# Patient Record
Sex: Female | Born: 1990 | Race: Black or African American | Hispanic: No | Marital: Single | Smoking: Never smoker
Health system: Southern US, Community
[De-identification: ages and names within clinical notes are randomized; demographics above are authoritative.]

## PROBLEM LIST (undated history)

## (undated) DIAGNOSIS — Z9889 Other specified postprocedural states: Secondary | ICD-10-CM

## (undated) DIAGNOSIS — R011 Cardiac murmur, unspecified: Secondary | ICD-10-CM

## (undated) DIAGNOSIS — J45909 Unspecified asthma, uncomplicated: Secondary | ICD-10-CM

## (undated) HISTORY — DX: Unspecified asthma, uncomplicated: J45.909

## (undated) HISTORY — DX: Cardiac murmur, unspecified: R01.1

---
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
## 2016-08-01 DIAGNOSIS — Z9889 Other specified postprocedural states: Secondary | ICD-10-CM

## 2016-08-01 HISTORY — PX: ARTHROSCOPIC REPAIR ACL: SUR80

## 2016-08-01 HISTORY — DX: Other specified postprocedural states: Z98.890

## 2019-03-18 ENCOUNTER — Ambulatory Visit: Payer: 59 | Admitting: Internal Medicine

## 2019-03-18 ENCOUNTER — Encounter: Payer: Self-pay | Admitting: Internal Medicine

## 2019-03-18 ENCOUNTER — Other Ambulatory Visit: Payer: Self-pay

## 2019-03-18 VITALS — BP 131/87 | HR 96 | Temp 99.1°F | Resp 16 | Ht 69.0 in | Wt 188.0 lb

## 2019-03-18 DIAGNOSIS — S83411A Sprain of medial collateral ligament of right knee, initial encounter: Secondary | ICD-10-CM

## 2019-03-18 DIAGNOSIS — M25561 Pain in right knee: Secondary | ICD-10-CM

## 2019-03-18 DIAGNOSIS — Z9889 Other specified postprocedural states: Secondary | ICD-10-CM

## 2019-03-18 DIAGNOSIS — M25569 Pain in unspecified knee: Secondary | ICD-10-CM | POA: Insufficient documentation

## 2019-03-18 NOTE — Progress Notes (Signed)
Went from standing to lying postion & felt a sharp pain on the left side of the knee.

## 2019-03-18 NOTE — Progress Notes (Signed)
S - 28 y.o. Black fermale with h/o ACL repair in 2018 who presents with right knee pain after firearms training today. Squatted down to the floor and hit her knee and felt pain that increased as day progressed. Happened at about 11 and contginued with training and did some kneeling after and was getting tight and more uncomfortable. 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   No current facility-administered medications on file prior to visit.     No tob hx  O - NAD, masked  BP 131/87 (BP Location: Right Arm, Patient Position: Sitting, Cuff Size: Normal)   Pulse 96   Temp 99.1 F (37.3 C) (Oral)   Resp 16   Ht 5\' 9"  (1.753 m)   Wt 188 lb (85.3 kg)   SpO2 95%   BMI 27.76 kg/m    Examined uninjured L knee first and stable,  R  Knee - Could not get to full flexion due to pain and mild swelling (not last 20 degrees), could fully extend.  Mild swelling noted anteriorly and prob mild effusion, 1+   No bruising  Not tender with movement of the patella, no gross deformity of patella  No marked tenderness in suprapatella bursa region  + medial joint line tenderness, with more tender above the joint line and progressing inferior on the medial side, no lateral joint line tenderness with palpation  Med collateral intact without marked gap on testing, and painful on testing the MCL and with foot movement laterally, LCL intact and no pain with testing  Lachman likely neg but a lot of guarding on testing for endpoint  And and post drawer and McMurrays limited on testing today, questioned some mild pain with limited testing McMurry's testing medial meniscus  No pain  posterior,    Affect was not flat, approp with conversation  A/p - R  knee pain, concern for MCL sprain (likely Grade 1) and possible medial meniscus involvement. Noted to her likely not a re-tear of the ACL graft and await further f/u to help with more certainty.   RICE modalities reviewed  Rec'ed liberal use of ice short term  ACE today for compression done in the office and continue  Will get an x-ray today or tomorrow at the latest - ordered  Activity modifications short term and elevate important.   Can use ibuprofen prn with food, not routinely rec'ed  F/u Wed late morning at the latest to re-assess before any planned to return to work entertained and review x-ray then as well.

## 2019-03-19 ENCOUNTER — Ambulatory Visit
Admission: RE | Admit: 2019-03-19 | Discharge: 2019-03-19 | Disposition: A | Discharge status: Home / Self Care | Payer: 59 | Source: Ambulatory Visit | Attending: Internal Medicine | Admitting: Internal Medicine

## 2019-03-19 ENCOUNTER — Other Ambulatory Visit: Payer: Self-pay

## 2019-03-19 DIAGNOSIS — M25561 Pain in right knee: Secondary | ICD-10-CM

## 2019-03-20 ENCOUNTER — Other Ambulatory Visit: Payer: 59 | Admitting: Internal Medicine

## 2019-03-20 ENCOUNTER — Encounter: Payer: Self-pay | Admitting: Internal Medicine

## 2019-03-20 ENCOUNTER — Ambulatory Visit: Payer: 59 | Admitting: Internal Medicine

## 2019-03-20 VITALS — BP 125/81 | HR 75 | Temp 97.8°F | Resp 14 | Ht 69.0 in | Wt 180.0 lb

## 2019-03-20 DIAGNOSIS — S83411D Sprain of medial collateral ligament of right knee, subsequent encounter: Secondary | ICD-10-CM

## 2019-03-20 DIAGNOSIS — S8001XD Contusion of right knee, subsequent encounter: Secondary | ICD-10-CM

## 2019-03-20 DIAGNOSIS — S8001XA Contusion of right knee, initial encounter: Secondary | ICD-10-CM | POA: Insufficient documentation
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
## 2019-03-27 ENCOUNTER — Encounter: Payer: Self-pay | Admitting: Registered Nurse

## 2019-03-27 ENCOUNTER — Other Ambulatory Visit: Payer: Self-pay

## 2019-03-27 ENCOUNTER — Ambulatory Visit: Payer: 59 | Admitting: Registered Nurse

## 2019-03-27 VITALS — BP 121/66 | HR 68 | Temp 98.7°F | Resp 16 | Ht 69.0 in | Wt 181.0 lb

## 2019-03-27 DIAGNOSIS — Z008 Encounter for other general examination: Secondary | ICD-10-CM

## 2019-03-27 NOTE — Progress Notes (Signed)
Subjective:    Patient ID: Hayley Avila, female    DOB: 07-Apr-1991, 28 y.o.   MRN: [id number]  28y/o established african american female patient to clinic new to me here for follow up right knee strain.  Pain and swelling resolved.  Initial visit with Dr Dorris FetchHendrickson 03/18/2019 and date of injury.  Patient stated she feels ready to return to full duty today/here for clearance.     Review of Systems  Constitutional: Negative for activity change, appetite change, chills, diaphoresis, fatigue and fever.  HENT: Negative for trouble swallowing and voice change.   Eyes: Negative for photophobia and visual disturbance.  Respiratory: Negative for cough.   Cardiovascular: Negative for leg swelling.  Gastrointestinal: Negative for diarrhea, nausea and vomiting.  Endocrine: Negative for cold intolerance and heat intolerance.  Genitourinary: Negative for difficulty urinating.  Musculoskeletal: Negative for arthralgias, gait problem, joint swelling, myalgias, neck pain and neck stiffness.  Skin: Negative for color change, pallor, rash and wound.  Hematological: Negative for adenopathy. Does not bruise/bleed easily.  Psychiatric/Behavioral: Negative for agitation, confusion and sleep disturbance.       Objective:   Physical Exam Vitals signs and nursing note reviewed.  Constitutional:      General: She is awake. She is not in acute distress.    Appearance: Normal appearance. She is well-developed, well-groomed and normal weight. She is not ill-appearing, toxic-appearing or diaphoretic.  HENT:     Head: Normocephalic and atraumatic.     Jaw: There is normal jaw occlusion.     Right Ear: Hearing and external ear normal.     Left Ear: Hearing and external ear normal.     Nose: Nose normal.     Mouth/Throat:     Lips: Pink. No lesions.     Mouth: Mucous membranes are moist.     Pharynx: Oropharynx is clear.  Eyes:     General: Lids are normal. Vision grossly intact. Gaze aligned  appropriately. No visual field deficit or scleral icterus.       Right eye: No discharge.        Left eye: No discharge.     Extraocular Movements: Extraocular movements intact.     Conjunctiva/sclera: Conjunctivae normal.     Pupils: Pupils are equal, round, and reactive to light.  Neck:     Musculoskeletal: Normal range of motion and neck supple. Normal range of motion. No edema, erythema, neck rigidity, crepitus, injury, pain with movement or torticollis.     Trachea: Trachea normal.  Cardiovascular:     Rate and Rhythm: Normal rate and regular rhythm.     Pulses: Normal pulses.     Heart sounds: Normal heart sounds.  Pulmonary:     Effort: Pulmonary effort is normal. No respiratory distress.     Breath sounds: Normal breath sounds and air entry. No stridor, decreased air movement or transmitted upper airway sounds. No decreased breath sounds, wheezing, rhonchi or rales.     Comments: Wearing cloth mask due to covid 19 pandemic; spoke full sentences without difficulty; no cough observed in exam room Abdominal:     General: Abdomen is flat.     Palpations: Abdomen is soft.  Musculoskeletal: Normal range of motion.        General: No swelling, tenderness, deformity or signs of injury.     Right shoulder: Normal.     Left shoulder: Normal.     Right elbow: Normal.    Left elbow: Normal.     Right hip:  Normal.     Left hip: Normal.     Right knee: Normal. She exhibits normal range of motion, no swelling, no effusion, no ecchymosis, no deformity, no laceration, no erythema, normal alignment, no LCL laxity, normal patellar mobility, no bony tenderness, normal meniscus and no MCL laxity. No tenderness found. No medial joint line, no lateral joint line, no MCL, no LCL and no patellar tendon tenderness noted.     Left knee: Normal. She exhibits normal range of motion, no swelling, no effusion, no ecchymosis, no deformity, no laceration, no erythema, normal alignment, no LCL laxity, normal  patellar mobility, no bony tenderness, normal meniscus and no MCL laxity. No tenderness found. No medial joint line, no lateral joint line, no MCL, no LCL and no patellar tendon tenderness noted.     Right ankle: Normal.     Left ankle: Normal.     Cervical back: Normal.     Thoracic back: Normal.     Lumbar back: Normal.     Right hand: Normal.     Left hand: Normal.     Right upper leg: Normal.     Left upper leg: Normal.     Right lower leg: No edema.     Left lower leg: No edema.     Comments: quadraceps distal right more bulk than left; negative mcmurrary's/lachmann's/valgus/varus stress and ant/posterior drawer signs or patellar displacement apprehension bilaterally; no crepitus; no ecchymosis or swelling or TTP; full AROM without pain; gait sure and steady in hallway; on/off exam table without difficulty  Lymphadenopathy:     Head:     Right side of head: No submental, submandibular, preauricular or posterior auricular adenopathy.     Left side of head: No submental, submandibular, preauricular or posterior auricular adenopathy.     Cervical: No cervical adenopathy.     Right cervical: No superficial cervical adenopathy.    Left cervical: No superficial cervical adenopathy.  Skin:    General: Skin is warm and dry.     Capillary Refill: Capillary refill takes less than 2 seconds.     Coloration: Skin is not ashen, cyanotic, jaundiced, mottled, pale or sallow.     Findings: No abrasion, abscess, acne, bruising, burn, ecchymosis, erythema, signs of injury, laceration, lesion, petechiae, rash or wound.     Nails: There is no clubbing.        Neurological:     General: No focal deficit present.     Mental Status: She is alert and oriented to person, place, and time. Mental status is at baseline.     GCS: GCS eye subscore is 4. GCS verbal subscore is 5. GCS motor subscore is 6.     Cranial Nerves: Cranial nerves are intact. No cranial nerve deficit, dysarthria or facial asymmetry.      Sensory: Sensation is intact. No sensory deficit.     Motor: Motor function is intact. No weakness, tremor, atrophy, abnormal muscle tone or seizure activity.     Coordination: Coordination is intact. Coordination normal.     Gait: Gait is intact. Gait normal.     Comments: Lower and upper extremity strength equal 5/5  Psychiatric:        Attention and Perception: Attention and perception normal.        Mood and Affect: Mood and affect normal.        Speech: Speech normal.        Behavior: Behavior normal. Behavior is cooperative.  Thought Content: Thought content normal.        Cognition and Memory: Cognition and memory normal.        Judgment: Judgment normal.           Assessment & Plan:  A-return to duty paperwork  P-Returned to full duty paperwork completed.  Patient given copy.  See scan in Epic and paper copy in outpatient chart located at [place] clinic site.  Patient instructed to give her supervisor copy.  Follow up for re-evaluation if pain reoccurs.  Patient verbalized understanding information/instructions, agreed with plan of care and had no further questions at this time.

## 2019-03-27 NOTE — Progress Notes (Signed)
Denies pain to right knee.  States it's good. 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

## 2019-03-27 NOTE — Patient Instructions (Signed)
Please give supervisor return to work paperwork

## 2019-03-29 ENCOUNTER — Other Ambulatory Visit
Admission: RE | Admit: 2019-03-29 | Discharge: 2019-03-29 | Disposition: A | Discharge status: Home / Self Care | Payer: 59 | Attending: Family Medicine | Admitting: Family Medicine

## 2019-05-23 ENCOUNTER — Ambulatory Visit: Payer: 59

## 2019-06-13 ENCOUNTER — Telehealth: Payer: Self-pay

## 2019-06-13 DIAGNOSIS — Z03818 Encounter for observation for suspected exposure to other biological agents ruled out: Secondary | ICD-10-CM | POA: Diagnosis not present

## 2019-06-13 NOTE — Telephone Encounter (Signed)
In the last 24 hours have you experienced any of these symptoms . Difficulty breathing - NO . Nasal congestion - YES . New cough - NO . Runny nose - NO . Shortness of breath - NO . New sore throat - YES . Unexplained body aches - NO . Nausea or vomiting - NO . Diarrhea - NO . Loss of taste or smell - NO . Fever (temp>100 F/37.8 C) or chills - NO  Exposure:  . Have you been in close contact with someone with confirmed diagnosis of COVID or person under going testing in past 14 days? 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 Have you been tested for COVID? If yes date, location, results in known - NO  . Have you been living in the same home as a person with confirmed diagnosis of COVID or a person undergoing testing? (household contact) - NO  . Have you been diagnosed with COVID or are you waiting on test results? - NO  . Have you traveled anywhere in the past 4 weeks? If yes where - Yes, traveled to [place], [place] a couple of days ago.  Advised Sheldon to go to [facility] for covid test.  Found out [facility] had to shut covid screening down today. Called Toniya & rerouted her to [facility] for covid test.  Verbalized understanding. [facility] Covid form faxed to [phone]. [facility] Covid team aware of individual being sent for covid test.  AMD

## 2019-10-11 ENCOUNTER — Encounter: Payer: Self-pay | Admitting: Emergency Medicine

## 2019-10-11 ENCOUNTER — Emergency Department
Admission: EM | Admit: 2019-10-11 | Discharge: 2019-10-11 | Disposition: A | Discharge status: Home / Self Care | Payer: Worker's Compensation | Attending: Emergency Medicine | Admitting: Emergency Medicine

## 2019-10-11 ENCOUNTER — Other Ambulatory Visit: Payer: Self-pay

## 2019-10-11 DIAGNOSIS — J45909 Unspecified asthma, uncomplicated: Secondary | ICD-10-CM | POA: Insufficient documentation

## 2019-10-11 DIAGNOSIS — Y990 Civilian activity done for income or pay: Secondary | ICD-10-CM | POA: Insufficient documentation

## 2019-10-11 DIAGNOSIS — Z91010 Allergy to peanuts: Secondary | ICD-10-CM | POA: Diagnosis not present

## 2019-10-11 DIAGNOSIS — Z041 Encounter for examination and observation following transport accident: Secondary | ICD-10-CM | POA: Insufficient documentation

## 2019-10-11 DIAGNOSIS — R0789 Other chest pain: Secondary | ICD-10-CM | POA: Diagnosis not present
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
## 2019-11-12 ENCOUNTER — Ambulatory Visit: Payer: Self-pay

## 2019-11-12 ENCOUNTER — Other Ambulatory Visit: Payer: Self-pay

## 2019-11-12 DIAGNOSIS — Z0000 Encounter for general adult medical examination without abnormal findings: Secondary | ICD-10-CM

## 2019-11-12 LAB — POCT URINALYSIS DIPSTICK
Bilirubin, UA: NEGATIVE
Glucose, UA: NEGATIVE
Ketones, UA: NEGATIVE
Leukocytes, UA: NEGATIVE
Nitrite, UA: NEGATIVE
Protein, UA: NEGATIVE
Spec Grav, UA: 1.02 (ref 1.010–1.025)
Urobilinogen, UA: 0.2 E.U./dL
pH, UA: 6 (ref 5.0–8.0)

## 2019-11-12 NOTE — Progress Notes (Signed)
Scheduled to complete physical 11/19/2019 with Bridget Hartshorn, PA-C (Interim Provider).  AMD

## 2019-11-13 LAB — CMP12+LP+TP+TSH+6AC+CBC/D/PLT
ALT: 8 IU/L (ref 0–32)
AST: 15 IU/L (ref 0–40)
Albumin/Globulin Ratio: 1.7 (ref 1.2–2.2)
Albumin: 4.5 g/dL (ref 3.9–5.0)
Alkaline Phosphatase: 63 IU/L (ref 39–117)
BUN/Creatinine Ratio: 10 (ref 9–23)
BUN: 9 mg/dL (ref 6–20)
Basophils Absolute: 0 10*3/uL (ref 0.0–0.2)
Basos: 1 %
Bilirubin Total: 0.5 mg/dL (ref 0.0–1.2)
Calcium: 9.7 mg/dL (ref 8.7–10.2)
Chloride: 103 mmol/L (ref 96–106)
Chol/HDL Ratio: 2.5 ratio (ref 0.0–4.4)
Cholesterol, Total: 195 mg/dL (ref 100–199)
Creatinine, Ser: 0.89 mg/dL (ref 0.57–1.00)
EOS (ABSOLUTE): 0.1 10*3/uL (ref 0.0–0.4)
Eos: 2 %
Estimated CHD Risk: <0.5 times avg. (ref 0.0–1.0)
Free Thyroxine Index: 1.5 (ref 1.2–4.9)
GFR calc Af Amer: 103 mL/min/{1.73_m2} (ref >59)
GFR calc non Af Amer: 90 mL/min/{1.73_m2} (ref >59)
GGT: 43 IU/L (ref 0–60)
Globulin, Total: 2.6 g/dL (ref 1.5–4.5)
Glucose: 87 mg/dL (ref 65–99)
HDL: 77 mg/dL (ref >39)
Hematocrit: 40.3 % (ref 34.0–46.6)
Hemoglobin: 13.2 g/dL (ref 11.1–15.9)
Immature Grans (Abs): 0 10*3/uL (ref 0.0–0.1)
Immature Granulocytes: 0 %
Iron: 127 ug/dL (ref 27–159)
LDH: 126 IU/L (ref 119–226)
LDL Chol Calc (NIH): 103 mg/dL — ABNORMAL HIGH (ref 0–99)
Lymphocytes Absolute: 1.5 10*3/uL (ref 0.7–3.1)
Lymphs: 40 %
MCH: 28.6 pg (ref 26.6–33.0)
MCHC: 32.8 g/dL (ref 31.5–35.7)
MCV: 87 fL (ref 79–97)
Monocytes Absolute: 0.3 10*3/uL (ref 0.1–0.9)
Monocytes: 9 %
Neutrophils Absolute: 1.8 10*3/uL (ref 1.4–7.0)
Neutrophils: 48 %
Phosphorus: 3.3 mg/dL (ref 3.0–4.3)
Platelets: 281 10*3/uL (ref 150–450)
Potassium: 4.3 mmol/L (ref 3.5–5.2)
RBC: 4.62 x10E6/uL (ref 3.77–5.28)
RDW: 12.4 % (ref 11.7–15.4)
Sodium: 139 mmol/L (ref 134–144)
T3 Uptake Ratio: 26 % (ref 24–39)
T4, Total: 5.6 ug/dL (ref 4.5–12.0)
TSH: 2.49 u[IU]/mL (ref 0.450–4.500)
Total Protein: 7.1 g/dL (ref 6.0–8.5)
Triglycerides: 83 mg/dL (ref 0–149)
Uric Acid: 5.1 mg/dL (ref 2.6–6.2)
VLDL Cholesterol Cal: 15 mg/dL (ref 5–40)
WBC: 3.7 10*3/uL (ref 3.4–10.8)

## 2019-11-19 ENCOUNTER — Encounter: Payer: Self-pay | Admitting: Emergency Medicine

## 2019-11-19 ENCOUNTER — Other Ambulatory Visit: Payer: Self-pay

## 2019-11-19 ENCOUNTER — Ambulatory Visit: Payer: Self-pay | Admitting: Emergency Medicine

## 2019-11-19 VITALS — BP 120/83 | HR 94 | Temp 98.8°F | Resp 12 | Ht 69.0 in | Wt 185.0 lb

## 2019-11-19 DIAGNOSIS — Z0000 Encounter for general adult medical examination without abnormal findings: Secondary | ICD-10-CM

## 2019-11-19 NOTE — Progress Notes (Signed)
Labs completed 11/12/19.  AMD

## 2019-11-19 NOTE — Progress Notes (Signed)
   I have reviewed the triage vital signs and the nursing notes.   HISTORY  Chief Complaint Employment Physical  HPI Hayley Avila is a 29 y.o. female is here for annual physical.  No complaints.        Past Medical History:  Diagnosis Date  . Asthma   . Heart murmur   . S/P ACL surgery 2018    Patient Active Problem List   Diagnosis Date Noted  . Contusion of right knee 03/20/2019  . Knee pain 03/18/2019  . Sprain of medial collateral ligament of right knee 03/18/2019  . History of repair of ACL 03/18/2019    Past Surgical History:  Procedure Laterality Date  . ARTHROSCOPIC REPAIR ACL Right 2018  . EYE SURGERY Right 2009   Cyst removed from schlers    Prior to Admission medications   Medication Sig Start Date End Date Taking? Authorizing Provider  albuterol (VENTOLIN HFA) 108 (90 Base) MCG/ACT inhaler Inhale 2 puffs into the lungs every 6 (six) hours as needed for wheezing or shortness of breath.   Yes [provider]    Allergies Peanut oil and Celery oil  Family History  Problem Relation Age of Onset  . Hypertension Mother   . Hypertension Father   . Multiple sclerosis Sister   . Colon cancer Maternal Grandmother     Social History Social History   Tobacco Use  . Smoking status: Never Smoker  . Smokeless tobacco: Never Used  Substance Use Topics  . Alcohol use: Yes  . Drug use: Never    Review of Systems Constitutional: No fever/chills Eyes: No visual changes. ENT: No complaints Cardiovascular: Denies chest pain. Respiratory: Denies shortness of breath. Gastrointestinal: No abdominal pain.  No nausea, no vomiting.  Genitourinary: Negative for dysuria. Musculoskeletal: Negative for back or joint aches. Skin: Negative for rash. Neurological: Negative for headaches, focal weakness or numbness.  ____________________________________________   PHYSICAL EXAM:  Constitutional: Alert and oriented. Well appearing and in no acute  distress. Eyes: Conjunctivae are normal. PERRL. EOMI. Head: Atraumatic. Neck: No stridor.  Nontender cervical spine to palpation posteriorly. Cardiovascular: Normal rate, regular rhythm. Grossly normal heart sounds.  Good peripheral circulation. Respiratory: Normal respiratory effort.  No retractions. Lungs CTAB. Gastrointestinal: Soft and nontender. No distention.  Bowel sounds normoactive x4 quadrants. Musculoskeletal: Nontender thoracic or lumbar spine.  Able to move upper and lower extremities without any difficulty.  Good muscle strength bilaterally.  Normal gait was noted. Neurologic:  Normal speech and language. No gross focal neurologic deficits are appreciated. No gait instability. Skin:  Skin is warm, dry and intact. No rash noted. Psychiatric: Mood and affect are normal. Speech and behavior are normal.  ____________________________________________   LABS Discussed with patient. ___________________________________________   FINAL CLINICAL IMPRESSION(S)  29 year old female presents to the clinic for annual physical.  She has no new complaints since her last physical.  Labs were reviewed.  No physical findings that were remarkable.  Normal physical exam.    ED Discharge Orders    None       Note:  This document was prepared using Dragon voice recognition software and may include unintentional dictation errors.

## 2019-11-25 DIAGNOSIS — J029 Acute pharyngitis, unspecified: Secondary | ICD-10-CM | POA: Diagnosis not present

## 2019-11-25 DIAGNOSIS — Z03818 Encounter for observation for suspected exposure to other biological agents ruled out: Secondary | ICD-10-CM | POA: Diagnosis not present

## 2020-03-06 DIAGNOSIS — Z03818 Encounter for observation for suspected exposure to other biological agents ruled out: Secondary | ICD-10-CM | POA: Diagnosis not present

## 2020-03-06 DIAGNOSIS — U071 COVID-19: Secondary | ICD-10-CM | POA: Diagnosis not present

## 2020-03-06 DIAGNOSIS — J40 Bronchitis, not specified as acute or chronic: Secondary | ICD-10-CM | POA: Diagnosis not present

## 2020-05-25 ENCOUNTER — Ambulatory Visit: Payer: Self-pay | Admitting: Physician Assistant

## 2020-05-25 ENCOUNTER — Other Ambulatory Visit: Payer: Self-pay

## 2020-05-25 VITALS — BP 120/83 | HR 76 | Temp 98.9°F | Resp 14 | Ht 69.0 in | Wt 185.0 lb

## 2020-05-25 DIAGNOSIS — S83411A Sprain of medial collateral ligament of right knee, initial encounter: Secondary | ICD-10-CM

## 2020-05-25 MED ORDER — NAPROXEN 500 MG PO TABS: 500.0000 mg | ORAL_TABLET | Freq: Two times a day (BID) | ORAL | 0 refills | Status: DC

## 2020-05-25 NOTE — Progress Notes (Signed)
   Subjective: Left knee pain    Patient ID: Hayley Avila, female    DOB: 1991/07/18, 29 y.o.   MRN: [id number]  HPI Patient presents with left knee pain secondary to a training incident 3 days ago.  Patient states she felt a "pop" medial aspect of her right knee.  Patient states pain increased with flexion of the knee and kneeling.  Patient described the pain as "achy".  Mild relief taking anti-inflammatory medications.  Patient works as a Emergency planning/management officer.   Review of Systems      Negative except for complaint Objective:   Physical Exam No acute distress.  Patient ambulates with slightly atypical gait.  Examination of the knee shows no obvious deformity.  Patient has inferior patella effusion.  Patient is moderate guarding palpation of the insertion point of the MCL.  Patient decreased strength against resistance with right knee comparison to the left.       Assessment & Plan: Knee pain secondary to MCL strain.  Patient placed in elastic knee support and given discharge care instruction.  Patient given prescription naproxen to take twice a day.  Patient will follow up in 3 days.

## 2020-05-28 ENCOUNTER — Ambulatory Visit: Payer: Self-pay | Admitting: Nurse Practitioner

## 2020-05-28 ENCOUNTER — Other Ambulatory Visit: Payer: Self-pay

## 2020-05-28 ENCOUNTER — Encounter: Payer: Self-pay | Admitting: Nurse Practitioner

## 2020-05-28 VITALS — BP 121/76 | HR 75 | Temp 98.3°F | Resp 14 | Ht 69.0 in | Wt 185.0 lb

## 2020-05-28 DIAGNOSIS — S83401D Sprain of unspecified collateral ligament of right knee, subsequent encounter: Secondary | ICD-10-CM

## 2020-05-28 MED ORDER — PREDNISONE 10 MG (21) PO TBPK: ORAL_TABLET | ORAL | 0 refills | Status: DC

## 2020-05-28 NOTE — Addendum Note (Signed)
Addended by: Janne Napoleon on: 05/28/2020 04:35 PM   Modules accepted: Orders

## 2020-05-28 NOTE — Progress Notes (Signed)
   Subjective:    Patient ID: Hayley Avila, female    DOB: March 28, 1991, 29 y.o.   MRN: [id number]  HPI Hayley Avila is a 29 y.o. female who presents to the [facility] for f/u of right knee pain s/p injury 05/22/20 while she was in a training session for her job in the [employer] department. She reports that the pain is the same as it was when she injured it. She stopped the Naprosyn because it made her sleepy. Hx of ACL injury 3 or more years ago.   Past Medical History:  Diagnosis Date  . Asthma   . Heart murmur   . S/P ACL surgery 2018   Current Outpatient Medications  Medication Instructions  . albuterol (VENTOLIN HFA) 108 (90 Base) MCG/ACT inhaler 2 puffs, Inhalation, Every 6 hours PRN  . naproxen (NAPROSYN) 500 mg, Oral, 2 times daily with meals   Allergies  Allergen Reactions  . Peanut Oil Rash  . Celery Oil    Review of Systems  Musculoskeletal: Positive for joint swelling.       Right knee pain  All other systems reviewed and are negative.     Objective:    Right knee with swelling and tenderness over the MCL. No obvious deformity, minimal effusion. Patient can flex and extend the knee with moderate pain. Patient is able to ambulate with slight limp. Pedal pulses 2+.     Assessment & Plan:  29 y.o. female with continued pain s/p right knee injury 10/22. Continue wearing knee sleeve. Will Rx prednisone and she will take OTC tylenol or ibuprofen as needed. She will return in 4 days for follow up. If no improvement will refer to ortho.

## 2020-05-28 NOTE — Progress Notes (Signed)
Pt states knee brace is not helping and pain is still the same, no worse or better.Hayley Avila

## 2020-06-02 ENCOUNTER — Ambulatory Visit: Payer: 59 | Admitting: Nurse Practitioner

## 2020-06-04 ENCOUNTER — Other Ambulatory Visit: Payer: Self-pay

## 2020-06-04 ENCOUNTER — Encounter: Payer: Self-pay | Admitting: Physician Assistant

## 2020-06-04 ENCOUNTER — Ambulatory Visit: Payer: Self-pay | Admitting: Physician Assistant

## 2020-06-04 VITALS — BP 121/89 | HR 85 | Temp 98.8°F | Resp 14 | Ht 69.0 in | Wt 196.0 lb

## 2020-06-04 DIAGNOSIS — S83401D Sprain of unspecified collateral ligament of right knee, subsequent encounter: Secondary | ICD-10-CM

## 2020-06-04 NOTE — Progress Notes (Signed)
   Subjective: Right knee sprain    Patient ID: Hayley Avila, female    DOB: 1990/09/01, 29 y.o.   MRN: [id number]  HPI Patient presents for evaluation of right knee pain secondary to MCL sprain.  Patient states much improvement is able to ambulate only mild discomfort. Review of Systems    Negative except for complaint Objective:   Physical Exam No acute distress.  Patient has normal gait.  Examination of the right knee reveals no edema, erythema ecchymosis or crepitus with palpation.  Patient has full and equal range of motion follow-up complaint of pain.  Patient was able to perform repeated squatting maneuvers for complaint.       Assessment & Plan: Resolved right knee MCL strain  Patient may return back to full duties effective 06/05/2020.  Return back to clinic if recurrence of complaint.

## 2020-07-31 DIAGNOSIS — Z03818 Encounter for observation for suspected exposure to other biological agents ruled out: Secondary | ICD-10-CM | POA: Diagnosis not present

## 2020-07-31 DIAGNOSIS — Z1152 Encounter for screening for COVID-19: Secondary | ICD-10-CM | POA: Diagnosis not present

## 2020-08-03 ENCOUNTER — Other Ambulatory Visit: Payer: Self-pay

## 2020-08-03 ENCOUNTER — Encounter: Payer: Self-pay | Admitting: Nurse Practitioner

## 2020-08-03 ENCOUNTER — Ambulatory Visit: Payer: Self-pay | Admitting: Nurse Practitioner

## 2020-08-03 VITALS — BP 138/86 | HR 86 | Temp 98.4°F | Resp 14 | Ht 69.0 in | Wt 197.0 lb

## 2020-08-03 DIAGNOSIS — J209 Acute bronchitis, unspecified: Secondary | ICD-10-CM

## 2020-08-03 DIAGNOSIS — J4521 Mild intermittent asthma with (acute) exacerbation: Secondary | ICD-10-CM

## 2020-08-03 MED ORDER — AZITHROMYCIN 250 MG PO TABS: ORAL_TABLET | ORAL | 0 refills | Status: DC

## 2020-08-03 MED ORDER — PREDNISONE 20 MG PO TABS: 20.0000 mg | ORAL_TABLET | Freq: Two times a day (BID) | ORAL | 0 refills | Status: AC

## 2020-08-03 NOTE — Progress Notes (Signed)
   Subjective:    Patient ID: Hayley Avila, female    DOB: Feb 18, 1991, 30 y.o.   MRN: [id number]  HPI  30 year old female history of asthma who started to feel sick with flu like symptoms 5 days ago. She was exposed to COVID 24 hours prior to symptom onset, she went for COVID testing the day she became symptomatic which was 07/31/20. Tested at [facility] with a rapid COVID test per patient was negative.  Since that time she has had a loss of appetite, some nausea, worsening cough associated with mucous production and wheezing also a fever up until last night she was 100 degrees.   She has been using her inhaler for relief and OTC cough and congestion medication  She had COVID over the summer and was on a prednisone taper at that time  Today's Vitals   08/03/20 1407  BP: 138/86  Pulse: 86  Resp: 14  Temp: 98.4 F (36.9 C)  TempSrc: Oral  SpO2: 95%  Weight: 197 lb (89.4 kg)  Height: 5\' 9"  (1.753 m)   Body mass index is 29.09 kg/m.  Review of Systems  Constitutional: Positive for fatigue and fever.  HENT: Positive for congestion and sinus pressure.   Respiratory: Positive for cough and wheezing.   Cardiovascular: Negative.   Gastrointestinal: Positive for nausea.  Musculoskeletal: Positive for myalgias.  Neurological: Positive for headaches.       Objective:   Physical Exam Constitutional:      Appearance: She is ill-appearing.  HENT:     Head: Normocephalic.     Nose: Nose normal.     Mouth/Throat:     Mouth: Mucous membranes are moist.  Eyes:     Pupils: Pupils are equal, round, and reactive to light.  Cardiovascular:     Rate and Rhythm: Normal rate and regular rhythm.  Pulmonary:     Breath sounds: Examination of the right-upper field reveals wheezing. Examination of the left-upper field reveals wheezing and rhonchi. Examination of the left-middle field reveals rhonchi. Wheezing and rhonchi present.  Musculoskeletal:     Cervical back: Neck supple.   Skin:    General: Skin is warm and dry.     Capillary Refill: Capillary refill takes less than 2 seconds.  Neurological:     Mental Status: She is alert and oriented to person, place, and time.  Psychiatric:        Mood and Affect: Mood normal.           Assessment & Plan:  Discussed with patient deep breathing exercises, proper nutrition and adequate intake with medications prescribed  She feels overall fatigued and unable to return to work at this time.  Will excuse for 48 hours while antibiotic and steroid course takes effect, she will continue to use her inhaler as directed  She will RTC if symptoms persist or with new concerns at that time will otherwise RT work 08/05/20  Meds ordered this encounter  Medications  . azithromycin (ZITHROMAX) 250 MG tablet    Sig: Take 2 tablets on day 1, then one tablet daily on days 2-5. Take with food.    Dispense:  6 tablet    Refill:  0  . predniSONE (DELTASONE) 20 MG tablet    Sig: Take 1 tablet (20 mg total) by mouth 2 (two) times daily with a meal for 5 days.    Dispense:  10 tablet    Refill:  0

## 2020-08-03 NOTE — Progress Notes (Signed)
Covid Screen completed at [facility].  S/Sx started last Thursday SOB, intermittent fever - has been up to 100, cough, nausea, headache Fatigue & sore throat & body aches resolved  Has used Advil & cough medicine  Hx of Asthma  Non-vaccinated  AMD

## 2020-12-04 ENCOUNTER — Ambulatory Visit: Payer: 59 | Admitting: Emergency Medicine

## 2020-12-07 ENCOUNTER — Ambulatory Visit: Payer: 59 | Admitting: Physician Assistant

## 2020-12-09 DIAGNOSIS — S76311A Strain of muscle, fascia and tendon of the posterior muscle group at thigh level, right thigh, initial encounter: Secondary | ICD-10-CM | POA: Diagnosis not present

## 2020-12-24 ENCOUNTER — Other Ambulatory Visit: Payer: Self-pay

## 2020-12-24 ENCOUNTER — Encounter: Payer: Self-pay | Admitting: Physician Assistant

## 2020-12-24 ENCOUNTER — Ambulatory Visit: Payer: Self-pay | Admitting: Physician Assistant

## 2020-12-24 VITALS — BP 121/84 | HR 101 | Temp 97.8°F | Resp 14 | Ht 69.0 in | Wt 195.0 lb

## 2020-12-24 DIAGNOSIS — S76311A Strain of muscle, fascia and tendon of the posterior muscle group at thigh level, right thigh, initial encounter: Secondary | ICD-10-CM

## 2020-12-24 NOTE — Progress Notes (Signed)
   Subjective: Right hamstring strain    Patient ID: Hayley Avila, female    DOB: [birth year], 30 y.o.   MRN: [id number]  HPI Patient requests return to work evaluation status post right hamstring strain which occurred on 12/09/2020.  Patient states she is now returned to baseline for physical activities.  Denies pain.   Review of Systems Negative septal complaint    Objective:   Physical Exam No obvious right lower extremity deformity.  Patient has full and equal l range of motion of the right lower extremity.  Strength against resistance 5/5 in comparison to the left lower extremity.       Assessment & Plan: Resolved hamstring strain  Patient return back to full duty.  Follow-up as necessary.

## 2021-01-01 ENCOUNTER — Other Ambulatory Visit: Payer: Self-pay

## 2021-01-01 ENCOUNTER — Ambulatory Visit: Payer: Self-pay

## 2021-01-01 DIAGNOSIS — Z0000 Encounter for general adult medical examination without abnormal findings: Secondary | ICD-10-CM

## 2021-01-01 NOTE — Progress Notes (Signed)
Pt scheduled to complete physical with Ron Smith,PA-C.  CL,RMA

## 2021-01-02 LAB — CMP12+LP+TP+TSH+6AC+CBC/D/PLT
ALT: 11 IU/L (ref 0–32)
AST: 15 IU/L (ref 0–40)
Albumin/Globulin Ratio: 2.2 (ref 1.2–2.2)
Albumin: 4.7 g/dL (ref 3.9–5.0)
Alkaline Phosphatase: 64 IU/L (ref 44–121)
BUN/Creatinine Ratio: 11 (ref 9–23)
BUN: 9 mg/dL (ref 6–20)
Basophils Absolute: 0 10*3/uL (ref 0.0–0.2)
Basos: 1 %
Bilirubin Total: 0.3 mg/dL (ref 0.0–1.2)
Calcium: 9.5 mg/dL (ref 8.7–10.2)
Chloride: 104 mmol/L (ref 96–106)
Chol/HDL Ratio: 2.8 ratio (ref 0.0–4.4)
Cholesterol, Total: 202 mg/dL — ABNORMAL HIGH (ref 100–199)
Creatinine, Ser: 0.83 mg/dL (ref 0.57–1.00)
EOS (ABSOLUTE): 0.1 10*3/uL (ref 0.0–0.4)
Eos: 3 %
Estimated CHD Risk: <0.5 times avg. (ref 0.0–1.0)
Free Thyroxine Index: 1.6 (ref 1.2–4.9)
GGT: 48 IU/L (ref 0–60)
Globulin, Total: 2.1 g/dL (ref 1.5–4.5)
Glucose: 98 mg/dL (ref 65–99)
HDL: 71 mg/dL (ref >39)
Hematocrit: 38.3 % (ref 34.0–46.6)
Hemoglobin: 12.9 g/dL (ref 11.1–15.9)
Immature Grans (Abs): 0 10*3/uL (ref 0.0–0.1)
Immature Granulocytes: 0 %
Iron: 75 ug/dL (ref 27–159)
LDH: 134 IU/L (ref 119–226)
LDL Chol Calc (NIH): 113 mg/dL — ABNORMAL HIGH (ref 0–99)
Lymphocytes Absolute: 1.7 10*3/uL (ref 0.7–3.1)
Lymphs: 43 %
MCH: 27.9 pg (ref 26.6–33.0)
MCHC: 33.7 g/dL (ref 31.5–35.7)
MCV: 83 fL (ref 79–97)
Monocytes Absolute: 0.4 10*3/uL (ref 0.1–0.9)
Monocytes: 10 %
Neutrophils Absolute: 1.7 10*3/uL (ref 1.4–7.0)
Neutrophils: 43 %
Phosphorus: 3.9 mg/dL (ref 3.0–4.3)
Platelets: 274 10*3/uL (ref 150–450)
Potassium: 4.1 mmol/L (ref 3.5–5.2)
RBC: 4.62 x10E6/uL (ref 3.77–5.28)
RDW: 12.2 % (ref 11.7–15.4)
Sodium: 138 mmol/L (ref 134–144)
T3 Uptake Ratio: 27 % (ref 24–39)
T4, Total: 6 ug/dL (ref 4.5–12.0)
TSH: 3.9 u[IU]/mL (ref 0.450–4.500)
Total Protein: 6.8 g/dL (ref 6.0–8.5)
Triglycerides: 100 mg/dL (ref 0–149)
Uric Acid: 4.6 mg/dL (ref 2.6–6.2)
VLDL Cholesterol Cal: 18 mg/dL (ref 5–40)
WBC: 4 10*3/uL (ref 3.4–10.8)
eGFR: 98 mL/min/{1.73_m2} (ref >59)

## 2021-01-07 ENCOUNTER — Ambulatory Visit: Payer: Self-pay | Admitting: Physician Assistant

## 2021-01-07 ENCOUNTER — Encounter: Payer: Self-pay | Admitting: Physician Assistant

## 2021-01-07 ENCOUNTER — Other Ambulatory Visit: Payer: Self-pay

## 2021-01-07 VITALS — BP 119/82 | HR 78 | Temp 97.3°F | Resp 12 | Ht 69.0 in | Wt 201.0 lb

## 2021-01-07 DIAGNOSIS — Z0000 Encounter for general adult medical examination without abnormal findings: Secondary | ICD-10-CM

## 2021-01-07 NOTE — Progress Notes (Signed)
   Subjective: Annual physical exam    Patient ID: Hayley Avila, female    DOB: 1991/04/19, 30 y.o.   MRN: [id number]  HPI Patient presents annual physical exam.  Patient with no concerns or complaints.   Review of Systems     Objective:   Physical Exam No acute distress.  Temperature 97.3, pulse 78, respiration 12, BP is 119/82, and patient 97% O2 sat on room air.  Patient weighs 201 pounds and BMI is 29.7. HEENT is unremarkable.  Neck is supple for adenopathy or bruits.  Lungs are clear to auscultation.  Heart regular rate and rhythm. Abdomen is negative HSM, normoactive bowel sounds, soft, nontender to palpation. No obvious deformity to the upper or lower extremities.  Patient has full and equal range of motion of the upper and lower extremities. No obvious deformity to the cervical lumbar spine.  Patient has full and equal range of motion of the cervical lumbar spine. Cranial nerves II through XII grossly intact.  DTR 2+ without clonus.       Assessment & Plan: Well exam.   Discussed lab results with patient showing slight elevation of cholesterol.  Advised conservative treatment consist of diet and exercise.

## 2021-01-07 NOTE — Progress Notes (Signed)
Hep B & HIV Screening - Not interested today.  May consider at a later time. Tdap - according to paper chart in clinic patient last received Tdap in 2014 PAP - [facility] doesn't do PAP smears - Encouraged to schedule appt with one of the following :  [facility]; [facility]; [facility] or Dr. Toya Smothers.  States she has been to [facility] in the past.  Will schedule her own GYN appt.  AMD

## 2021-03-16 ENCOUNTER — Other Ambulatory Visit: Payer: Self-pay

## 2021-03-16 ENCOUNTER — Ambulatory Visit: Payer: Self-pay | Admitting: Physician Assistant

## 2021-03-16 ENCOUNTER — Encounter: Payer: Self-pay | Admitting: Physician Assistant

## 2021-03-16 VITALS — BP 133/88 | HR 96 | Temp 98.2°F | Resp 14 | Ht 69.0 in | Wt 190.0 lb

## 2021-03-16 DIAGNOSIS — S0083XA Contusion of other part of head, initial encounter: Secondary | ICD-10-CM
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
## 2021-05-06 DIAGNOSIS — Z3202 Encounter for pregnancy test, result negative: Secondary | ICD-10-CM | POA: Diagnosis not present

## 2021-05-06 DIAGNOSIS — R002 Palpitations: Secondary | ICD-10-CM | POA: Diagnosis not present

## 2021-05-07 ENCOUNTER — Other Ambulatory Visit: Payer: Self-pay

## 2021-05-07 ENCOUNTER — Emergency Department: Payer: 59

## 2021-05-07 ENCOUNTER — Encounter: Payer: Self-pay | Admitting: Emergency Medicine

## 2021-05-07 ENCOUNTER — Emergency Department
Admission: EM | Admit: 2021-05-07 | Discharge: 2021-05-07 | Disposition: A | Discharge status: Home / Self Care | Payer: 59 | Attending: Emergency Medicine | Admitting: Emergency Medicine

## 2021-05-07 DIAGNOSIS — R0789 Other chest pain: Secondary | ICD-10-CM | POA: Diagnosis not present

## 2021-05-07 DIAGNOSIS — J45909 Unspecified asthma, uncomplicated: Secondary | ICD-10-CM | POA: Insufficient documentation

## 2021-05-07 DIAGNOSIS — R002 Palpitations: Secondary | ICD-10-CM | POA: Diagnosis not present

## 2021-05-07 DIAGNOSIS — R079 Chest pain, unspecified: Secondary | ICD-10-CM | POA: Diagnosis not present

## 2021-05-07 DIAGNOSIS — I493 Ventricular premature depolarization: Secondary | ICD-10-CM | POA: Diagnosis not present

## 2021-05-07 DIAGNOSIS — I491 Atrial premature depolarization: Secondary | ICD-10-CM | POA: Diagnosis not present

## 2021-05-07 LAB — BASIC METABOLIC PANEL
Anion gap: 11 (ref 5–15)
BUN: 11 mg/dL (ref 6–20)
CO2: 24 mmol/L (ref 22–32)
Calcium: 9.4 mg/dL (ref 8.9–10.3)
Chloride: 101 mmol/L (ref 98–111)
Creatinine, Ser: 0.94 mg/dL (ref 0.44–1.00)
GFR, Estimated: >60 mL/min (ref >60)
Glucose, Bld: 108 mg/dL — ABNORMAL HIGH (ref 70–99)
Potassium: 3.5 mmol/L (ref 3.5–5.1)
Sodium: 136 mmol/L (ref 135–145)

## 2021-05-07 LAB — CBC
HCT: 40.6 % (ref 36.0–46.0)
Hemoglobin: 13.9 g/dL (ref 12.0–15.0)
MCH: 29.1 pg (ref 26.0–34.0)
MCHC: 34.2 g/dL (ref 30.0–36.0)
MCV: 85.1 fL (ref 80.0–100.0)
Platelets: 269 10*3/uL (ref 150–400)
RBC: 4.77 MIL/uL (ref 3.87–5.11)
RDW: 12.4 % (ref 11.5–15.5)
WBC: 5.7 10*3/uL (ref 4.0–10.5)
nRBC: 0 % (ref 0.0–0.2)

## 2021-05-07 LAB — T4, FREE: Free T4: 0.71 ng/dL (ref 0.61–1.12)

## 2021-05-07 LAB — MAGNESIUM: Magnesium: 1.8 mg/dL (ref 1.7–2.4)

## 2021-05-07 LAB — TROPONIN I (HIGH SENSITIVITY): Troponin I (High Sensitivity): 4 ng/L (ref <18)

## 2021-05-07 LAB — TSH: TSH: 5.06 u[IU]/mL — ABNORMAL HIGH (ref 0.350–4.500)

## 2021-05-07 NOTE — ED Notes (Signed)
Lab call for new lab add on

## 2021-05-07 NOTE — ED Provider Notes (Signed)
[facility] Emergency Department Provider Note  ____________________________________________   Event Date/Time   First MD Initiated Contact with Patient 05/07/21 0401     (approximate)  I have reviewed the triage vital signs and the nursing notes.   HISTORY  Chief Complaint Chest Pain    HPI Hayley Avila is a 30 y.o. female with history of heart murmur, asthma who presents to the emergency department with complaints of palpitations for the past 2 to 3 days.  States she went to urgent care and they told her if she developed any chest pain she should come to the emergency department.  States today she started having sharp central chest pain that would last for about 1 to 2 seconds and then resolved.  No associated fevers, cough, shortness of breath, nausea, vomiting, diarrhea.  She states she just feels her heart "beating".  She denies ever having similar symptoms.  She does drink 1 cup of coffee a day.  Denies any other increased caffeine use, stimulant use, illicit drug use.  No history of PE, DVT, exogenous estrogen use, recent fractures, surgery, trauma, hospitalization, prolonged travel or other immobilization. No lower extremity swelling or pain. No calf tenderness.  No chest pain currently.        Past Medical History:  Diagnosis Date   Asthma    Heart murmur    S/P ACL surgery 2018    Patient Active Problem List   Diagnosis Date Noted   Contusion of right knee 03/20/2019   Knee pain 03/18/2019   Sprain of medial collateral ligament of right knee 03/18/2019   History of repair of ACL 03/18/2019    Past Surgical History:  Procedure Laterality Date   ARTHROSCOPIC REPAIR ACL Right 2018   EYE SURGERY Right 2009   Cyst removed from schlers    Prior to Admission medications   Medication Sig Start Date End Date Taking? Authorizing Provider  albuterol (VENTOLIN HFA) 108 (90 Base) MCG/ACT inhaler Inhale 2 puffs into the lungs every 6 (six)  hours as needed for wheezing or shortness of breath.    [provider]  naproxen (NAPROSYN) 500 MG tablet Take 1 tablet (500 mg total) by mouth 2 (two) times daily with a meal. 05/25/20   Joni Reining, PA-C    Allergies Peanut oil and Celery oil  Family History  Problem Relation Age of Onset   Hypertension Mother    Hypertension Father    Multiple sclerosis Sister    Colon cancer Maternal Grandmother     Social History Social History   Tobacco Use   Smoking status: Never   Smokeless tobacco: Never  Vaping Use   Vaping Use: Former  Substance Use Topics   Alcohol use: Yes   Drug use: Never    Review of Systems Constitutional: No fever. Eyes: No visual changes. ENT: No sore throat. Cardiovascular: + chest pain. Respiratory: Denies shortness of breath. Gastrointestinal: No nausea, vomiting, diarrhea. Genitourinary: Negative for dysuria. Musculoskeletal: Negative for back pain. Skin: Negative for rash. Neurological: Negative for focal weakness or numbness.  ____________________________________________   PHYSICAL EXAM:  VITAL SIGNS: ED Triage Vitals  Enc Vitals Group     BP 05/07/21 0217 (!) 128/95     Pulse Rate 05/07/21 0217 80     Resp 05/07/21 0217 (!) 21     Temp 05/07/21 0217 98.3 F (36.8 C)     Temp Source 05/07/21 0217 Oral     SpO2 05/07/21 0217 96 %  Weight 05/07/21 0212 200 lb (90.7 kg)     Height 05/07/21 0212 5\' 9"  (1.753 m)     Head Circumference --      Peak Flow --      Pain Score 05/07/21 0212 4     Pain Loc --      Pain Edu? --      Excl. in GC? --    CONSTITUTIONAL: Alert and oriented and responds appropriately to questions. Well-appearing; well-nourished HEAD: Normocephalic EYES: Conjunctivae clear, pupils appear equal, EOM appear intact ENT: normal nose; moist mucous membranes NECK: Supple, normal ROM, no thyromegaly CARD: RRR; S1 and S2 appreciated; no murmurs, no clicks, no rubs, no gallops RESP: Normal chest  excursion without splinting or tachypnea; breath sounds clear and equal bilaterally; no wheezes, no rhonchi, no rales, no hypoxia or respiratory distress, speaking full sentences ABD/GI: Normal bowel sounds; non-distended; soft, non-tender, no rebound, no guarding, no peritoneal signs, no hepatosplenomegaly BACK: The back appears normal EXT: Normal ROM in all joints; no deformity noted, no edema; no cyanosis, no calf tenderness or calf swelling SKIN: Normal color for age and race; warm; no rash on exposed skin NEURO: Moves all extremities equally PSYCH: The patient's mood and manner are appropriate.  ____________________________________________   LABS (all labs ordered are listed, but only abnormal results are displayed)  Labs Reviewed  BASIC METABOLIC PANEL - Abnormal; Notable for the following components:      Result Value   Glucose, Bld 108 (*)    All other components within normal limits  TSH - Abnormal; Notable for the following components:   TSH 5.060 (*)    All other components within normal limits  CBC  MAGNESIUM  T4, FREE  POC URINE PREG, ED  TROPONIN I (HIGH SENSITIVITY)   ____________________________________________  EKG   EKG Interpretation  Date/Time:  Friday May 07 2021 02:13:23 EDT Ventricular Rate:  91 PR Interval:  174 QRS Duration: 70 QT Interval:  360 QTC Calculation: 442 R Axis:   71 Text Interpretation: Sinus rhythm with Premature atrial complexes Cannot rule out Anterior infarct , age undetermined Abnormal ECG Confirmed by [id number] [phone]) on 05/07/2021 4:09:12 AM        ____________________________________________  RADIOLOGY 07/07/2021 Jaicion Laurie, personally viewed and evaluated these images (plain radiographs) as part of my medical decision making, as well as reviewing the written report by the radiologist.  ED MD interpretation: Chest x-ray clear  Official radiology report(s): DG Chest 2 View  Result Date: 05/07/2021 CLINICAL DATA:   Chest pain for 2 days with cardiac palpitations EXAM: CHEST - 2 VIEW COMPARISON:  None. FINDINGS: The heart size and mediastinal contours are within normal limits. Both lungs are clear. The visualized skeletal structures are unremarkable. IMPRESSION: No active cardiopulmonary disease. Electronically Signed   By: 07/07/2021 M.D.   On: 05/07/2021 02:38    ____________________________________________   PROCEDURES  Procedure(s) performed (including Critical Care):  Procedures   ____________________________________________   INITIAL IMPRESSION / ASSESSMENT AND PLAN / ED COURSE  As part of my medical decision making, I reviewed the following data within the electronic MEDICAL RECORD NUMBER Nursing notes reviewed and incorporated, Labs reviewed , EKG interpreted , Old EKG reviewed, Radiograph reviewed , and Notes from prior ED visits         Patient here with complaints of palpitations for the past couple of days as well as atypical chest pain.  Her EKG is nonischemic but she does have occasional PACs.  This  could be the cause of her symptoms however she states that she is feeling symptomatic while I am in the room and there is no PACs, PVCs Or other abnormality seen on cardiac monitoring.  PACs seem to be infrequent.  No other arrhythmia noted.  Her electrolytes, hemoglobin, cardiac labs are normal.  She denies illicit drug use, stimulant use other than 1 cup of coffee a day.  Recommend that she cut down on caffeine intake and increase fluids at home.  We will check thyroid function today, pregnancy test.  We will continue to monitor for any acute abnormalities on cardiac monitoring.  ED PROGRESS  Patient continues to be well-appearing.  No evidence seen on cardiac monitoring.  TSH elevated but free T4 normal.  Potassium, magnesium normal.  Pregnancy test negative.  I feel she is safe to be discharged home.  Given outpatient follow-up for primary care physician as well as cardiology if she  continues to have palpitations that she may benefit from Holter monitoring as an outpatient.  She verbalized understanding and is comfortable with this plan.  Doubt ACS.  She is PERC negative.  Doubt dissection.  Chest x-ray shows no infiltrate, edema, pneumothorax, widened mediastinum, cardiomegaly.   At this time, I do not feel there is any life-threatening condition present. I have reviewed, interpreted and discussed all results (EKG, imaging, lab, urine as appropriate) and exam findings with patient/family. I have reviewed nursing notes and appropriate previous records.  I feel the patient is safe to be discharged home without further emergent workup and can continue workup as an outpatient as needed. Discussed usual and customary return precautions. Patient/family verbalize understanding and are comfortable with this plan.  Outpatient follow-up has been provided as needed. All questions have been answered.  ____________________________________________   FINAL CLINICAL IMPRESSION(S) / ED DIAGNOSES  Final diagnoses:  Palpitations  Atypical chest pain  Premature atrial contractions     ED Discharge Orders     None       *Please note:  Shantaya Bluestone was evaluated in Emergency Department on 05/07/2021 for the symptoms described in the history of present illness. She was evaluated in the context of the global COVID-19 pandemic, which necessitated consideration that the patient might be at risk for infection with the SARS-CoV-2 virus that causes COVID-19. Institutional protocols and algorithms that pertain to the evaluation of patients at risk for COVID-19 are in a state of rapid change based on information released by regulatory bodies including the CDC and federal and state organizations. These policies and algorithms were followed during the patient's care in the ED.  Some ED evaluations and interventions may be delayed as a result of limited staffing during and the pandemic.*   Note:   This document was prepared using Dragon voice recognition software and may include unintentional dictation errors.    Glee Lashomb, Layla Maw, DO 05/07/21 [phone]

## 2021-05-07 NOTE — ED Triage Notes (Signed)
Pt arrived via POV with c/o 2 days of chest pain and palpitations, was at urgent care yesterday but was referred to ED.  Denies any nausea, dizziness, back pain.   Pt states the chest pain is intermittent.

## 2021-05-07 NOTE — Discharge Instructions (Signed)
Steps to find a Primary Care Provider (PCP):  Call [phone] or [phone] to access "[facility] Find a Doctor Service."  2.  You may also go on the  website at [email or web address]  

## 2021-05-07 NOTE — ED Notes (Signed)
POC Urine Preg (-)

## 2021-07-19 ENCOUNTER — Ambulatory Visit: Payer: 59 | Admitting: Family Medicine

## 2021-12-20 DIAGNOSIS — M25561 Pain in right knee: Secondary | ICD-10-CM | POA: Insufficient documentation

## 2022-01-05 ENCOUNTER — Ambulatory Visit: Payer: Self-pay | Admitting: Physician Assistant

## 2022-01-05 ENCOUNTER — Encounter: Payer: Self-pay | Admitting: Physician Assistant

## 2022-01-05 VITALS — BP 124/76 | HR 98 | Temp 97.7°F | Resp 14 | Ht 69.0 in | Wt 190.0 lb

## 2022-01-05 DIAGNOSIS — S8001XD Contusion of right knee, subsequent encounter: Secondary | ICD-10-CM

## 2022-01-05 NOTE — Progress Notes (Signed)
   Subjective: Right knee contusion    Patient ID: Hayley Avila, female    DOB: [birth year], 31 y.o.   MRN: [id number]  HPI Patient here today for reevaluation for full duty secondary to a right knee contusion from 12/20/2021.  Patient has finished physical therapy and released from orthopedic care.  Patient states she is feeling better.   Review of Systems Negative except for chief complaint    Objective:   Physical Exam BP is 124/76, pulse 98, respiration 14, temperature 97.7, patient 96% O2 sat on room air.  Patient weighs 190 pounds and BMI is 28.06. Patient ambulates with normal gait.  No obvious deformity to the right knee.  Patient has full and equal range of motion.  Patient is neurovascular intact and strength against resistance is 5/5       Assessment & Plan:

## 2022-01-05 NOTE — Progress Notes (Signed)
Pt presents today for WC follow up on Right knee. Pt states she's feeling a lot better/CL,RMA

## 2022-01-13 ENCOUNTER — Ambulatory Visit: Payer: Self-pay

## 2022-01-13 DIAGNOSIS — Z0000 Encounter for general adult medical examination without abnormal findings: Secondary | ICD-10-CM

## 2022-01-13 LAB — POCT URINALYSIS DIPSTICK
Bilirubin, UA: NEGATIVE
Blood, UA: POSITIVE
Glucose, UA: NEGATIVE
Ketones, UA: NEGATIVE
Leukocytes, UA: NEGATIVE
Nitrite, UA: NEGATIVE
Protein, UA: POSITIVE — AB
Spec Grav, UA: >=1.03 — AB (ref 1.010–1.025)
Urobilinogen, UA: 0.2 E.U./dL
pH, UA: 6 (ref 5.0–8.0)

## 2022-01-13 NOTE — Progress Notes (Signed)
Pt presents today for physical labs, will return to clinic for scheduled physical.  

## 2022-01-14 LAB — CMP12+LP+TP+TSH+6AC+CBC/D/PLT
ALT: 13 IU/L (ref 0–32)
AST: 20 IU/L (ref 0–40)
Albumin/Globulin Ratio: 2 (ref 1.2–2.2)
Albumin: 4.9 g/dL (ref 3.9–5.0)
Alkaline Phosphatase: 75 IU/L (ref 44–121)
BUN/Creatinine Ratio: 10 (ref 9–23)
BUN: 10 mg/dL (ref 6–20)
Basophils Absolute: 0.1 10*3/uL (ref 0.0–0.2)
Basos: 1 %
Bilirubin Total: <0.2 mg/dL (ref 0.0–1.2)
Calcium: 9.4 mg/dL (ref 8.7–10.2)
Chloride: 102 mmol/L (ref 96–106)
Chol/HDL Ratio: 3.2 ratio (ref 0.0–4.4)
Cholesterol, Total: 236 mg/dL — ABNORMAL HIGH (ref 100–199)
Creatinine, Ser: 0.96 mg/dL (ref 0.57–1.00)
EOS (ABSOLUTE): 0.1 10*3/uL (ref 0.0–0.4)
Eos: 3 %
Estimated CHD Risk: <0.5 times avg. (ref 0.0–1.0)
Free Thyroxine Index: 1.9 (ref 1.2–4.9)
GGT: 70 IU/L — ABNORMAL HIGH (ref 0–60)
Globulin, Total: 2.5 g/dL (ref 1.5–4.5)
Glucose: 93 mg/dL (ref 70–99)
HDL: 73 mg/dL (ref >39)
Hematocrit: 41.3 % (ref 34.0–46.6)
Hemoglobin: 14 g/dL (ref 11.1–15.9)
Immature Grans (Abs): 0 10*3/uL (ref 0.0–0.1)
Immature Granulocytes: 0 %
Iron: 59 ug/dL (ref 27–159)
LDH: 136 IU/L (ref 119–226)
LDL Chol Calc (NIH): 129 mg/dL — ABNORMAL HIGH (ref 0–99)
Lymphocytes Absolute: 1.8 10*3/uL (ref 0.7–3.1)
Lymphs: 44 %
MCH: 28.6 pg (ref 26.6–33.0)
MCHC: 33.9 g/dL (ref 31.5–35.7)
MCV: 85 fL (ref 79–97)
Monocytes Absolute: 0.4 10*3/uL (ref 0.1–0.9)
Monocytes: 9 %
Neutrophils Absolute: 1.7 10*3/uL (ref 1.4–7.0)
Neutrophils: 43 %
Phosphorus: 3.2 mg/dL (ref 3.0–4.3)
Platelets: 298 10*3/uL (ref 150–450)
Potassium: 4.2 mmol/L (ref 3.5–5.2)
RBC: 4.89 x10E6/uL (ref 3.77–5.28)
RDW: 12.8 % (ref 11.7–15.4)
Sodium: 139 mmol/L (ref 134–144)
T3 Uptake Ratio: 28 % (ref 24–39)
T4, Total: 6.7 ug/dL (ref 4.5–12.0)
TSH: 4.76 u[IU]/mL — ABNORMAL HIGH (ref 0.450–4.500)
Total Protein: 7.4 g/dL (ref 6.0–8.5)
Triglycerides: 194 mg/dL — ABNORMAL HIGH (ref 0–149)
Uric Acid: 6.1 mg/dL (ref 2.6–6.2)
VLDL Cholesterol Cal: 34 mg/dL (ref 5–40)
WBC: 4 10*3/uL (ref 3.4–10.8)
eGFR: 82 mL/min/{1.73_m2} (ref >59)

## 2022-01-24 ENCOUNTER — Ambulatory Visit: Payer: Self-pay | Admitting: Physician Assistant

## 2022-01-24 ENCOUNTER — Encounter: Payer: Self-pay | Admitting: Physician Assistant

## 2022-01-24 VITALS — BP 132/84 | HR 86 | Temp 97.3°F | Resp 12 | Ht 69.0 in | Wt 201.0 lb

## 2022-01-24 DIAGNOSIS — E782 Mixed hyperlipidemia: Secondary | ICD-10-CM

## 2022-01-24 DIAGNOSIS — Z0000 Encounter for general adult medical examination without abnormal findings: Secondary | ICD-10-CM

## 2022-01-24 MED ORDER — ROSUVASTATIN CALCIUM 40 MG PO TABS: 40.0000 mg | ORAL_TABLET | Freq: Every day | ORAL | 3 refills | Status: DC

## 2022-03-22 ENCOUNTER — Other Ambulatory Visit: Payer: Self-pay

## 2022-03-22 DIAGNOSIS — Z0283 Encounter for blood-alcohol and blood-drug test: Secondary | ICD-10-CM

## 2022-03-22 NOTE — Progress Notes (Signed)
Presents to [facility] for random drug screen & breath alcohol screen.  [facility] Acct #:  [id number] [facility] Specimen #:  [id number]  Breath Alcohol Screen Results = .000  AMD

## 2022-05-09 ENCOUNTER — Other Ambulatory Visit (HOSPITAL_COMMUNITY): Payer: Self-pay | Admitting: Sports Medicine

## 2022-05-09 ENCOUNTER — Other Ambulatory Visit: Payer: Self-pay | Admitting: Sports Medicine

## 2022-05-09 DIAGNOSIS — G8929 Other chronic pain: Secondary | ICD-10-CM

## 2022-07-18 DIAGNOSIS — R197 Diarrhea, unspecified: Secondary | ICD-10-CM | POA: Diagnosis not present

## 2022-07-18 DIAGNOSIS — R1032 Left lower quadrant pain: Secondary | ICD-10-CM | POA: Diagnosis not present

## 2022-09-18 DIAGNOSIS — R059 Cough, unspecified: Secondary | ICD-10-CM | POA: Diagnosis not present

## 2022-09-18 DIAGNOSIS — J3089 Other allergic rhinitis: Secondary | ICD-10-CM | POA: Diagnosis not present

## 2022-09-18 DIAGNOSIS — Z20822 Contact with and (suspected) exposure to covid-19: Secondary | ICD-10-CM | POA: Diagnosis not present

## 2022-10-21 IMAGING — CR DG CHEST 2V
1 series · 2 of 2 positions shown · non-contrast
Comparison: None.

CLINICAL DATA: Chest pain for 2 days with cardiac palpitations

EXAM:
CHEST - 2 VIEW

[Series 1: dg chest 2 view · 0.14mm/px · 2 of 2 slices shown]
[im 1/2]
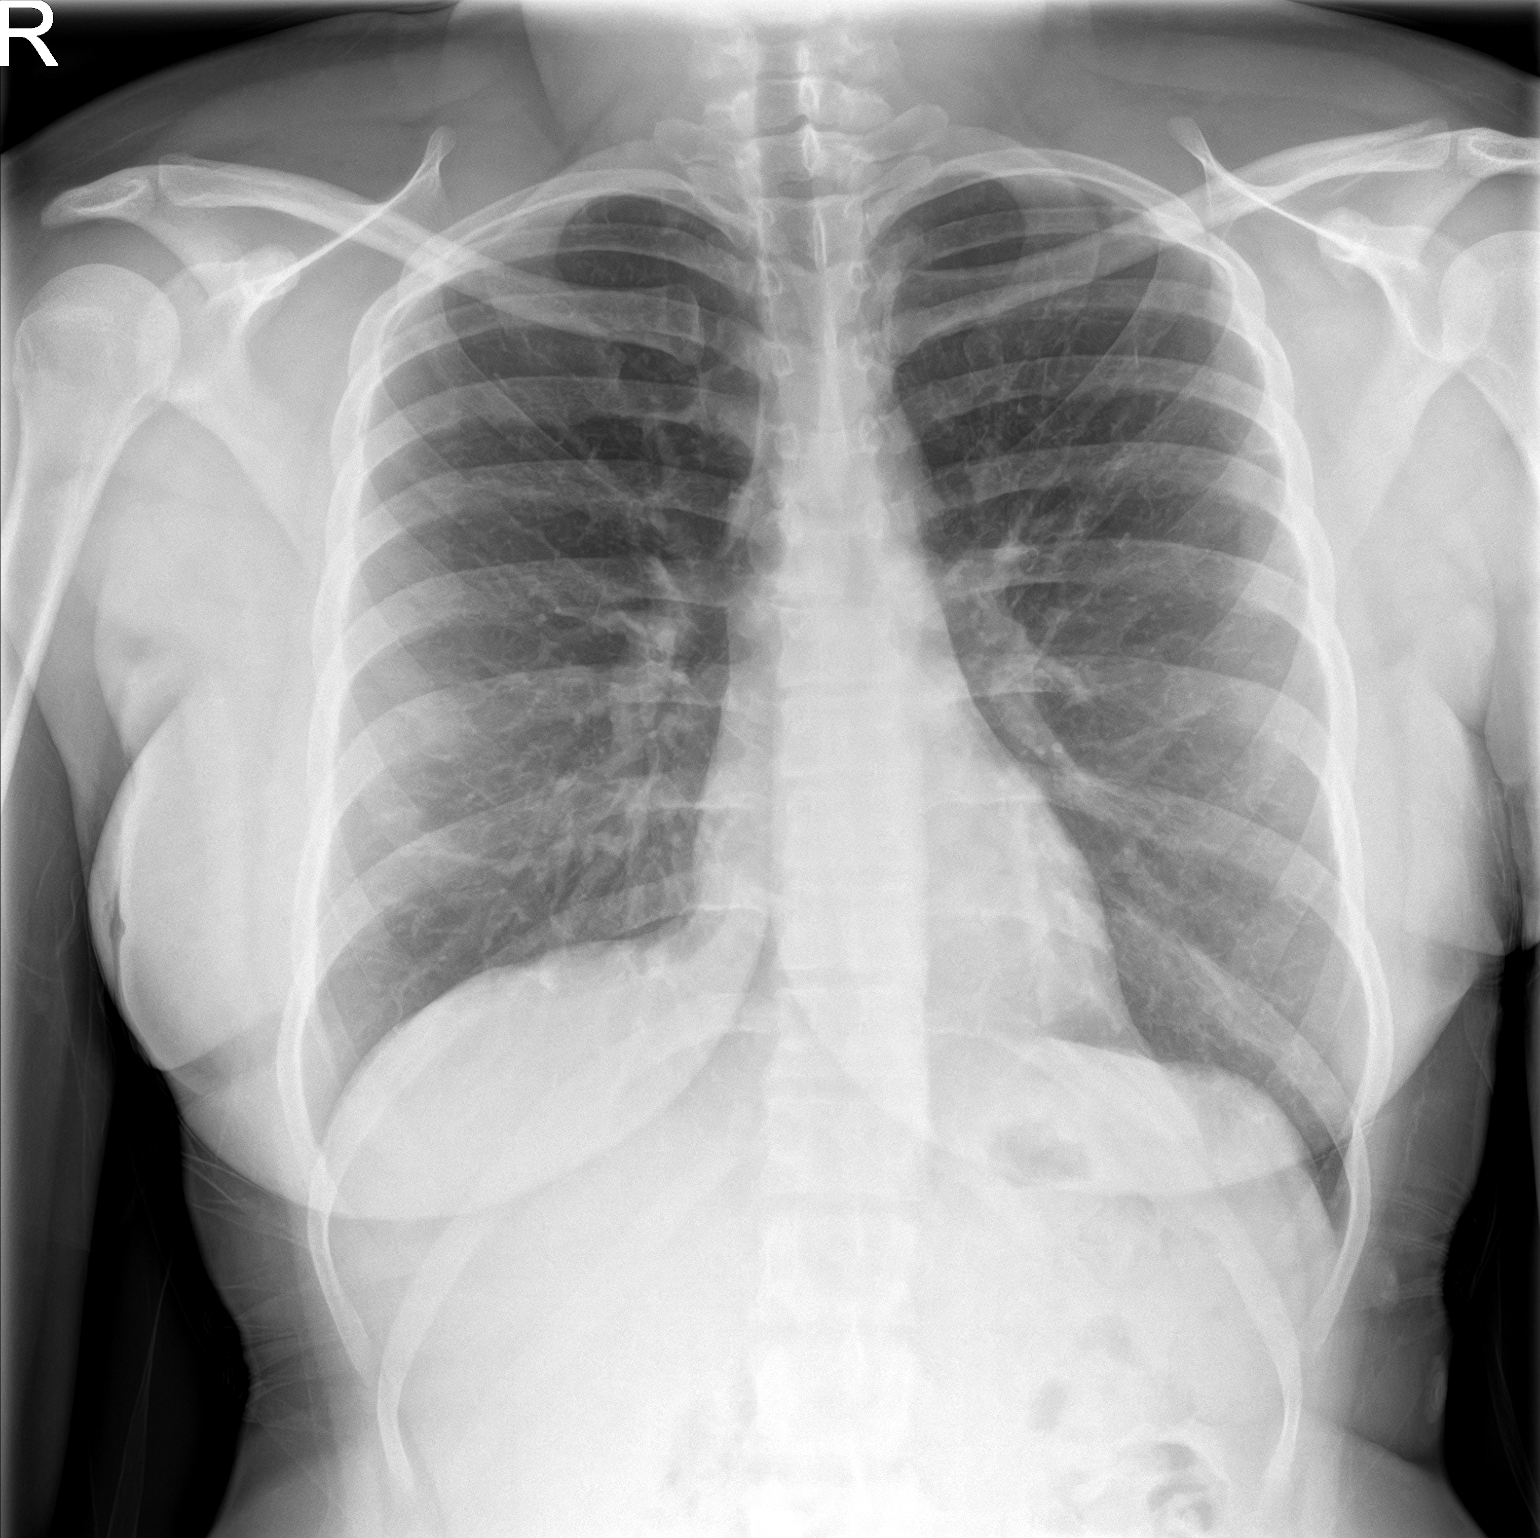
[im 2/2]
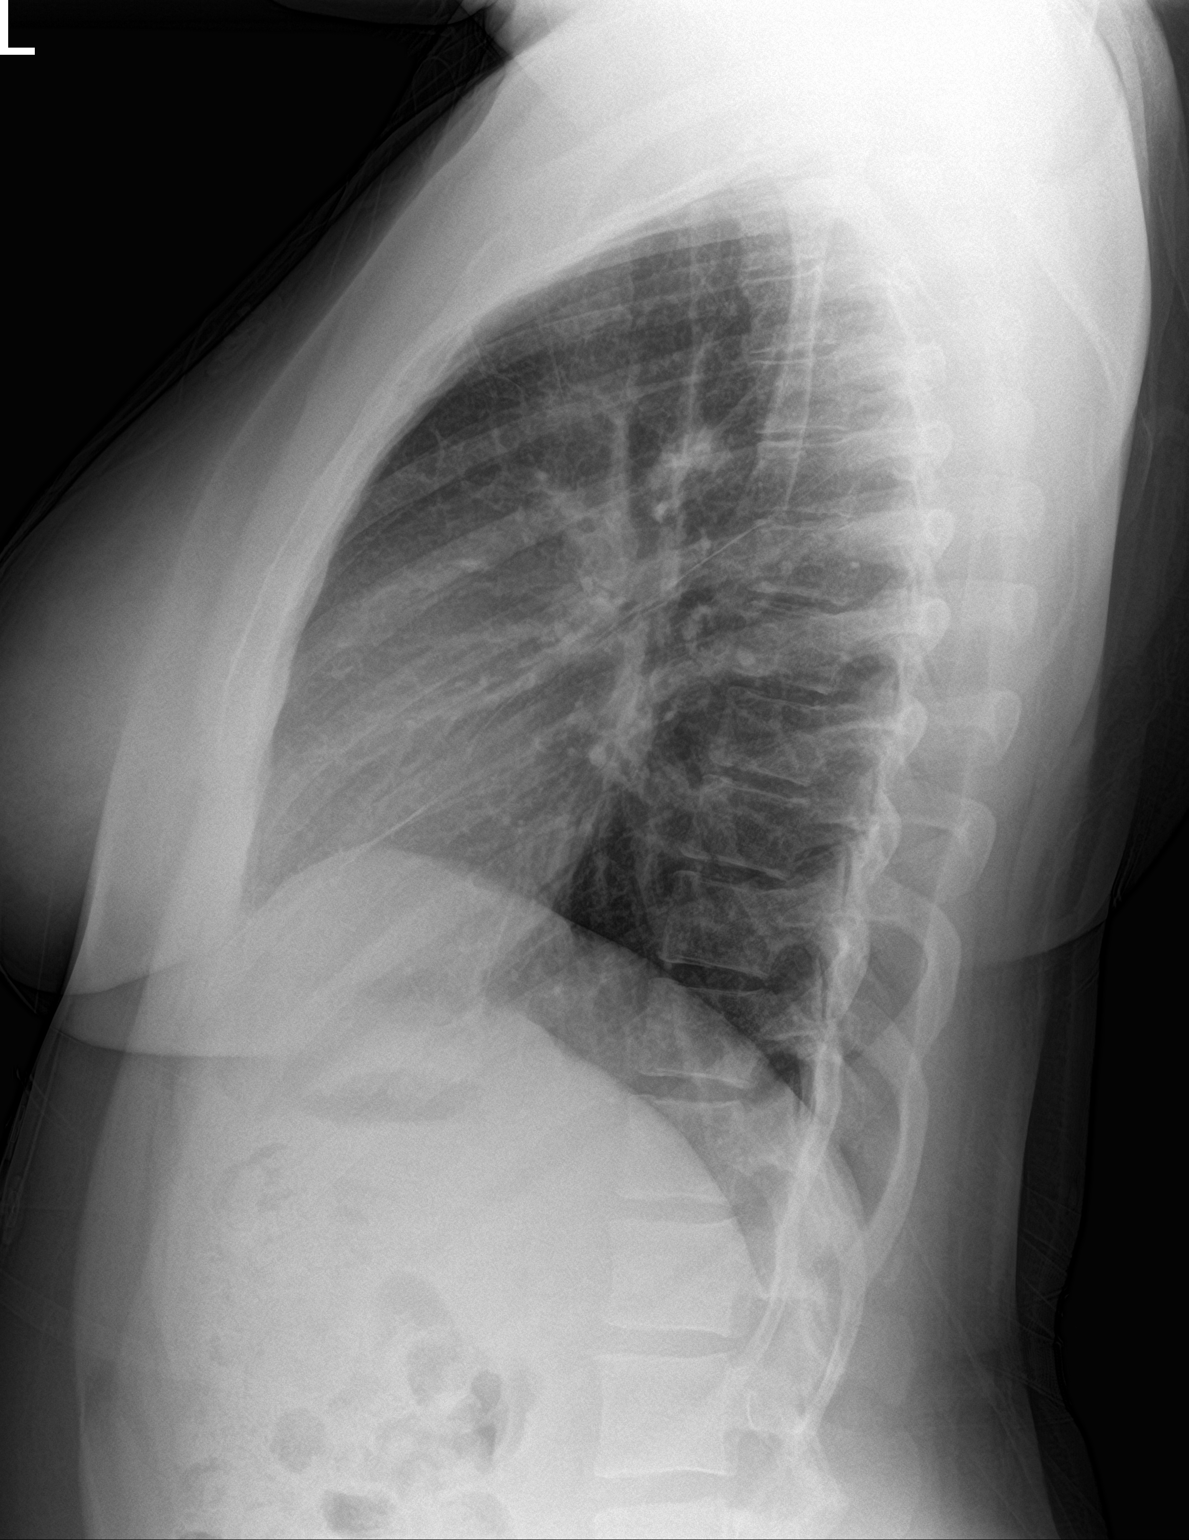

[2 of 2 positions shown; findings below may reference images not displayed]

FINDINGS: The heart size and mediastinal contours are within normal limits.
Both lungs are clear. The visualized skeletal structures are
unremarkable.
IMPRESSION: No active cardiopulmonary disease.

## 2022-11-28 ENCOUNTER — Other Ambulatory Visit: Payer: Self-pay

## 2022-11-28 DIAGNOSIS — Z0283 Encounter for blood-alcohol and blood-drug test: Secondary | ICD-10-CM

## 2022-11-28 NOTE — Progress Notes (Signed)
Random UDS and BAT completed for [facility].

## 2022-12-28 ENCOUNTER — Ambulatory Visit: Payer: Self-pay

## 2022-12-28 DIAGNOSIS — Z0000 Encounter for general adult medical examination without abnormal findings: Secondary | ICD-10-CM

## 2022-12-28 LAB — POCT URINALYSIS DIPSTICK
Bilirubin, UA: NEGATIVE
Blood, UA: POSITIVE
Glucose, UA: NEGATIVE
Ketones, UA: NEGATIVE
Leukocytes, UA: NEGATIVE
Nitrite, UA: NEGATIVE
Protein, UA: NEGATIVE
Spec Grav, UA: 1.015 (ref 1.010–1.025)
Urobilinogen, UA: 0.2 E.U./dL
pH, UA: 6 (ref 5.0–8.0)

## 2022-12-29 LAB — CMP12+LP+TP+TSH+6AC+CBC/D/PLT
ALT: 10 IU/L (ref 0–32)
AST: 15 IU/L (ref 0–40)
Albumin/Globulin Ratio: 1.9 (ref 1.2–2.2)
Albumin: 4.4 g/dL (ref 4.0–5.0)
Alkaline Phosphatase: 70 IU/L (ref 44–121)
BUN/Creatinine Ratio: 9 (ref 9–23)
BUN: 9 mg/dL (ref 6–20)
Basophils Absolute: 0 10*3/uL (ref 0.0–0.2)
Basos: 1 %
Bilirubin Total: 0.3 mg/dL (ref 0.0–1.2)
Calcium: 9.3 mg/dL (ref 8.7–10.2)
Chloride: 102 mmol/L (ref 96–106)
Chol/HDL Ratio: 2.9 ratio (ref 0.0–4.4)
Cholesterol, Total: 213 mg/dL — ABNORMAL HIGH (ref 100–199)
Creatinine, Ser: 1 mg/dL (ref 0.57–1.00)
EOS (ABSOLUTE): 0.1 10*3/uL (ref 0.0–0.4)
Eos: 2 %
Estimated CHD Risk: <0.5 times avg. (ref 0.0–1.0)
Free Thyroxine Index: 1.4 (ref 1.2–4.9)
GGT: 44 IU/L (ref 0–60)
Globulin, Total: 2.3 g/dL (ref 1.5–4.5)
Glucose: 92 mg/dL (ref 70–99)
HDL: 74 mg/dL (ref >39)
Hematocrit: 39.8 % (ref 34.0–46.6)
Hemoglobin: 13.1 g/dL (ref 11.1–15.9)
Immature Grans (Abs): 0 10*3/uL (ref 0.0–0.1)
Immature Granulocytes: 0 %
Iron: 86 ug/dL (ref 27–159)
LDH: 133 IU/L (ref 119–226)
LDL Chol Calc (NIH): 124 mg/dL — ABNORMAL HIGH (ref 0–99)
Lymphocytes Absolute: 1.5 10*3/uL (ref 0.7–3.1)
Lymphs: 36 %
MCH: 28.1 pg (ref 26.6–33.0)
MCHC: 32.9 g/dL (ref 31.5–35.7)
MCV: 85 fL (ref 79–97)
Monocytes Absolute: 0.4 10*3/uL (ref 0.1–0.9)
Monocytes: 9 %
Neutrophils Absolute: 2.1 10*3/uL (ref 1.4–7.0)
Neutrophils: 52 %
Phosphorus: 3.1 mg/dL (ref 3.0–4.3)
Platelets: 280 10*3/uL (ref 150–450)
Potassium: 4.4 mmol/L (ref 3.5–5.2)
RBC: 4.67 x10E6/uL (ref 3.77–5.28)
RDW: 12.6 % (ref 11.7–15.4)
Sodium: 138 mmol/L (ref 134–144)
T3 Uptake Ratio: 24 % (ref 24–39)
T4, Total: 5.7 ug/dL (ref 4.5–12.0)
TSH: 2.24 u[IU]/mL (ref 0.450–4.500)
Total Protein: 6.7 g/dL (ref 6.0–8.5)
Triglycerides: 87 mg/dL (ref 0–149)
Uric Acid: 5.9 mg/dL (ref 2.6–6.2)
VLDL Cholesterol Cal: 15 mg/dL (ref 5–40)
WBC: 4.1 10*3/uL (ref 3.4–10.8)
eGFR: 78 mL/min/{1.73_m2} (ref >59)

## 2023-01-03 ENCOUNTER — Encounter: Payer: Self-pay | Admitting: Physician Assistant

## 2023-01-03 ENCOUNTER — Ambulatory Visit: Payer: Self-pay | Admitting: Physician Assistant

## 2023-01-03 VITALS — BP 126/84 | HR 77 | Temp 97.8°F | Resp 14

## 2023-01-03 DIAGNOSIS — Z0000 Encounter for general adult medical examination without abnormal findings: Secondary | ICD-10-CM

## 2023-01-03 NOTE — Progress Notes (Signed)
Here for yearly physical with [facility] and denies complaints.  Stated she wants to start taking her Crestor.

## 2023-01-03 NOTE — Progress Notes (Signed)
[facility]  ____________________________________________   None    (approximate)  I have reviewed the triage vital signs and the nursing notes.   HISTORY  Chief Complaint No chief complaint on file.    HPI Hayley Avila is a 32 y.o. female patient presents for annual physical exam.  Voices no concerns or complaints.         Past Medical History:  Diagnosis Date   Asthma    Heart murmur    S/P ACL surgery 2018    Patient Active Problem List   Diagnosis Date Noted   Pain in joint of right knee 12/20/2021   Contusion of right knee 03/20/2019   Knee pain 03/18/2019   Sprain of medial collateral ligament of right knee 03/18/2019   History of repair of ACL 03/18/2019    Past Surgical History:  Procedure Laterality Date   ARTHROSCOPIC REPAIR ACL Right 2018   EYE SURGERY Right 2009   Cyst removed from schlers    Prior to Admission medications   Medication Sig Start Date End Date Taking? Authorizing Provider  albuterol (VENTOLIN HFA) 108 (90 Base) MCG/ACT inhaler Inhale 2 puffs into the lungs every 6 (six) hours as needed for wheezing or shortness of breath.   Yes [provider]  rosuvastatin (CRESTOR) 40 MG tablet Take 1 tablet (40 mg total) by mouth daily. 01/24/22  Yes Joni Reining, PA-C    Allergies Peanut oil and Celery oil  Family History  Problem Relation Age of Onset   Hypertension Mother    Hypertension Father    Multiple sclerosis Sister    Colon cancer Maternal Grandmother     Social History Social History   Tobacco Use   Smoking status: Never   Smokeless tobacco: Never  Vaping Use   Vaping Use: Former  Substance Use Topics   Alcohol use: Yes   Drug use: Never    Review of Systems Constitutional: No fever/chills Eyes: No visual changes. ENT: No sore throat. Cardiovascular: Denies chest pain. Respiratory: Denies shortness of breath. Gastrointestinal: No abdominal pain.  No nausea,  no vomiting.  No diarrhea.  No constipation. Genitourinary: Negative for dysuria. Musculoskeletal: Negative for back pain. Skin: Negative for rash. Neurological: Negative for headaches, focal weakness or numbness. Endocrine: Hyperlipidemia and hypertension Hematological/Lymphatic:  Allergic/Immunilogical: Celery and peanut open ____________________________________________   PHYSICAL EXAM: VITAL SIGNS: BP 126/84  Pulse 77  Resp 14  Temp 97.8 F (36.6 C)  SpO2 100 %      Constitutional: Alert and oriented. Well appearing and in no acute distress. Eyes: Conjunctivae are normal. PERRL. EOMI. Head: Atraumatic. Nose: No congestion/rhinnorhea. Mouth/Throat: Mucous membranes are moist.  Oropharynx non-erythematous. Neck: No stridor.  No cervical spine tenderness to palpation. Hematological/Lymphatic/Immunilogical: No cervical lymphadenopathy. Cardiovascular: Normal rate, regular rhythm. Grossly normal heart sounds.  Good peripheral circulation. Respiratory: Normal respiratory effort.  No retractions. Lungs CTAB. Gastrointestinal: Soft and nontender. No distention. No abdominal bruits. No CVA tenderness. Genitourinary: Deferred Musculoskeletal: No lower extremity tenderness nor edema.  No joint effusions. Neurologic:  Normal speech and language. No gross focal neurologic deficits are appreciated. No gait instability. Skin:  Skin is warm, dry and intact. No rash noted. Psychiatric: Mood and affect are normal. Speech and behavior are normal.  ____________________________________________   LABS       Component Ref Range & Units 6 d ago 11 mo ago 3 yr ago  Color, UA Yellow Dark Yellow Yellow  Clarity, UA Clear Clear Clear  Glucose, UA Negative Negative Negative Negative  Bilirubin, UA Negative Negative Negative  Ketones, UA Negative Negative Negative  Spec Grav, UA 1.010 - 1.025 1.015 >=1.030 Abnormal  1.020  Blood, UA Positive Positive CM Postive CM  Comment: 1+  pH,  UA 5.0 - 8.0 6.0 6.0 6.0  Protein, UA Negative Negative Positive Abnormal  CM Negative  Urobilinogen, UA 0.2 or 1.0 E.U./dL 0.2 0.2 0.2  Nitrite, UA Negative Negative Negative  Leukocytes, UA Negative Negative Negative Negative  Appearance     Odor                      Component Ref Range & Units 6 d ago (12/28/22) 11 mo ago (01/13/22) 1 yr ago (05/07/21) 1 yr ago (05/07/21) 1 yr ago (05/07/21) 2 yr ago (01/01/21) 3 yr ago (11/12/19)  Glucose 70 - 99 mg/dL 92 93  161 High  CM  98 R 87 R  Uric Acid 2.6 - 6.2 mg/dL 5.9 6.1 CM    4.6 CM 5.1 CM  Comment:            Therapeutic target for gout patients: <6.0  BUN 6 - 20 mg/dL 9 10  11  9 9   Creatinine, Ser 0.57 - 1.00 mg/dL 0.96 0.45  4.09 R  8.11 0.89  eGFR >59 mL/min/1.73 78 82    98   BUN/Creatinine Ratio 9 - 23 9 10    11 10   Sodium 134 - 144 mmol/L 138 139  136 R  138 139  Potassium 3.5 - 5.2 mmol/L 4.4 4.2  3.5 R  4.1 4.3  Chloride 96 - 106 mmol/L 102 102  101 R  104 103  Calcium 8.7 - 10.2 mg/dL 9.3 9.4  9.4 R  9.5 9.7  Phosphorus 3.0 - 4.3 mg/dL 3.1 3.2    3.9 3.3  Total Protein 6.0 - 8.5 g/dL 6.7 7.4    6.8 7.1  Albumin 4.0 - 5.0 g/dL 4.4 4.9 R    4.7 R 4.5 R  Globulin, Total 1.5 - 4.5 g/dL 2.3 2.5    2.1 2.6  Albumin/Globulin Ratio 1.2 - 2.2 1.9 2.0    2.2 1.7  Bilirubin Total 0.0 - 1.2 mg/dL 0.3 <9.1    0.3 0.5  Alkaline Phosphatase 44 - 121 IU/L 70 75    64 63 R  LDH 119 - 226 IU/L 133 136    134 126  AST 0 - 40 IU/L 15 20    15 15   ALT 0 - 32 IU/L 10 13    11 8   GGT 0 - 60 IU/L 44 70 High     48 43  Iron 27 - 159 ug/dL 86 59    75 478  Cholesterol, Total 100 - 199 mg/dL 295 High  621 High     202 High  195  Triglycerides 0 - 149 mg/dL 87 308 High     657 83  HDL >39 mg/dL 74 73    71 77  VLDL Cholesterol Cal 5 - 40 mg/dL 15 34    18 15  LDL Chol Calc (NIH) 0 - 99 mg/dL 846 High  962 High     113 High  103 High   Chol/HDL Ratio 0.0 - 4.4 ratio 2.9 3.2 CM    2.8 CM 2.5 CM  Comment:  T. Chol/HDL Ratio                                             Men  Women                               1/2 Avg.Risk  3.4    3.3                                   Avg.Risk  5.0    4.4                                2X Avg.Risk  9.6    7.1                                3X Avg.Risk 23.4   11.0  Estimated CHD Risk 0.0 - 1.0 times avg.  < 0.5  < 0.5 CM     < 0.5 CM  < 0.5 CM  Comment: The CHD Risk is based on the T. Chol/HDL ratio. Other factors affect CHD Risk such as hypertension, smoking, diabetes, severe obesity, and family history of premature CHD.  TSH 0.450 - 4.500 uIU/mL 2.240 4.760 High  5.060 High  R, CM   3.900 2.490  T4, Total 4.5 - 12.0 ug/dL 5.7 6.7    6.0 5.6  T3 Uptake Ratio 24 - 39 % 24 28    27 26   Free Thyroxine Index 1.2 - 4.9 1.4 1.9    1.6 1.5  WBC 3.4 - 10.8 x10E3/uL 4.1 4.0   5.7 R 4.0 3.7  RBC 3.77 - 5.28 x10E6/uL 4.67 4.89   4.77 R 4.62 4.62  Hemoglobin 11.1 - 15.9 g/dL 16.1 09.6   04.5 R 40.9 13.2  Hematocrit 34.0 - 46.6 % 39.8 41.3   40.6 R 38.3 40.3  MCV 79 - 97 fL 85 85   85.1 R 83 87  MCH 26.6 - 33.0 pg 28.1 28.6   29.1 R 27.9 28.6  MCHC 31.5 - 35.7 g/dL 81.1 91.4   78.2 R 95.6 32.8  RDW 11.7 - 15.4 % 12.6 12.8   12.4 R 12.2 12.4  Platelets 150 - 450 x10E3/uL 280 298   269 R 274 281  Neutrophils Not Estab. % 52 43    43 48  Lymphs Not Estab. % 36 44    43 40  Monocytes Not Estab. % 9 9    10 9   Eos Not Estab. % 2 3    3 2   Basos Not Estab. % 1 1    1 1   Neutrophils Absolute 1.4 - 7.0 x10E3/uL 2.1 1.7    1.7 1.8  Lymphocytes Absolute 0.7 - 3.1 x10E3/uL 1.5 1.8    1.7 1.5  Monocytes Absolute 0.1 - 0.9 x10E3/uL 0.4 0.4    0.4 0.3  EOS (ABSOLUTE) 0.0 - 0.4 x10E3/uL 0.1 0.1    0.1 0.1  Basophils Absolute 0.0 - 0.2 x10E3/uL 0.0 0.1    0.0 0.0  Immature Granulocytes Not Estab. % 0 0    0 0  Immature Grans (Abs)  ____________________________________________  ___________________________________________    ____________________________________________   INITIAL IMPRESSION / ASSESSMENT AND PLAN  As part of my medical decision making, I reviewed the following data within the electronic MEDICAL RECORD NUMBER      No acute findings on physical exam and labs.        ____________________________________________   FINAL CLINICAL IMPRESSION  Well exam   ED Discharge Orders     None        Note:  This document was prepared using Dragon voice recognition software and may include unintentional dictation errors.

## 2023-02-01 ENCOUNTER — Ambulatory Visit: Payer: Self-pay | Admitting: Physician Assistant

## 2023-02-01 ENCOUNTER — Encounter: Payer: Self-pay | Admitting: Physician Assistant

## 2023-02-01 VITALS — BP 133/86 | HR 87 | Resp 14 | Ht 69.0 in | Wt 200.0 lb

## 2023-02-01 DIAGNOSIS — M5410 Radiculopathy, site unspecified: Secondary | ICD-10-CM

## 2023-02-01 MED ORDER — METHYLPREDNISOLONE 4 MG PO TBPK: ORAL_TABLET | ORAL | 0 refills | Status: DC

## 2023-02-01 MED ORDER — ORPHENADRINE CITRATE ER 100 MG PO TB12: 100.0000 mg | ORAL_TABLET | Freq: Two times a day (BID) | ORAL | 0 refills | Status: DC

## 2023-02-01 NOTE — Progress Notes (Signed)
   Subjective: Left radicular back pain    Patient ID: Hayley Avila, female    DOB: 07-Jan-1991, 32 y.o.   MRN: [id number]  HPI Patient complain of radicular back pain secondary to prolonged driving.  Patient received with the [place] which was 9-hour drive.  Patient states upon return trip she notes increasing back pain and started to rest area.  Patient stated on exiting the car she noticed a radicular component pain to the right buttocks.  Patient denies bladder or bowel dysfunction.  No history of chronic back pain.  Patient rates pain as a 5/10.  No palliative measures taken for complaint.   Review of Systems Negative except for above complaint.    Objective:   Physical Exam BP 133/86  Pulse 87  Resp 14  SpO2 96 %  Weight 200 lb (90.7 kg)  Height 5\' 9"  (1.753 m)  No acute distress.  Ambulates with normal gait and posture. Examination of back reveals no obvious deformity.  Patient has full and equal range of motion.  Right paraspinal muscle spasm with lateral and flexion movements.  Patient negative straight leg test in the supine position.       Assessment & Plan: Radicular back pain  Patient given a prescription for Norflex and Medrol Dosepak.  Patient advised take medication as directed and follow-up in 5 days if no improvement or worsening of complaint.

## 2023-02-01 NOTE — Progress Notes (Signed)
Pt presents today with right lower back pain that radiates into right buttocks.  Yesterday pt drove back from [facility]; on the way back pt went to get out the car and immediately felt pain.

## 2023-02-22 ENCOUNTER — Other Ambulatory Visit: Payer: Self-pay

## 2023-02-22 DIAGNOSIS — Z0283 Encounter for blood-alcohol and blood-drug test: Secondary | ICD-10-CM

## 2023-02-22 NOTE — Progress Notes (Signed)
Presents to [facility] for random drug & alcohol screen.  [facility] Acct #:  [id number] [facility] Specimen #:  [id number]  Alcohol Screen Results = 0.000  AMD

## 2023-09-11 ENCOUNTER — Other Ambulatory Visit: Payer: Self-pay

## 2023-09-11 DIAGNOSIS — Z0283 Encounter for blood-alcohol and blood-drug test: Secondary | ICD-10-CM

## 2023-09-11 NOTE — Progress Notes (Signed)
 Presents to [facility] for random drug screen & breath alcohol screen.  [facility] Acct #:  [id number] [facility] Specimen #:  [id number]  Breath Alcohol Results = 0.000

## 2023-10-30 ENCOUNTER — Encounter: Payer: Self-pay | Admitting: Physician Assistant

## 2023-10-30 ENCOUNTER — Ambulatory Visit: Payer: Self-pay | Admitting: Physician Assistant

## 2023-10-30 ENCOUNTER — Ambulatory Visit: Admitting: Physician Assistant

## 2023-10-30 VITALS — BP 121/78 | HR 88 | Temp 98.6°F | Resp 16 | Ht 69.0 in | Wt 200.0 lb

## 2023-10-30 DIAGNOSIS — Z1152 Encounter for screening for COVID-19: Secondary | ICD-10-CM

## 2023-10-30 LAB — POC COVID19 BINAXNOW: SARS Coronavirus 2 Ag: POSITIVE — AB

## 2023-10-30 MED ORDER — NAPHAZOLINE-PHENIRAMINE 0.025-0.3 % OP SOLN: 1.0000 [drp] | Freq: Four times a day (QID) | OPHTHALMIC | 0 refills | Status: DC | PRN

## 2023-10-30 MED ORDER — NIRMATRELVIR/RITONAVIR (PAXLOVID)TABLET: 3.0000 | ORAL_TABLET | Freq: Two times a day (BID) | ORAL | 0 refills | Status: AC

## 2023-10-30 MED ORDER — PSEUDOEPH-BROMPHEN-DM 30-2-10 MG/5ML PO SYRP: 5.0000 mL | ORAL_SOLUTION | Freq: Four times a day (QID) | ORAL | 0 refills | Status: DC | PRN

## 2023-10-30 MED ORDER — IBUPROFEN 800 MG PO TABS: 800.0000 mg | ORAL_TABLET | Freq: Three times a day (TID) | ORAL | 0 refills | Status: AC | PRN

## 2023-10-30 NOTE — Progress Notes (Signed)
   Subjective: Viral illness    Patient ID: Hayley Avila, female    DOB: 26-Aug-1990, 33 y.o.   MRN: [id number]  HPI Patient complaining of 3 days of bodyaches, fatigue, watery eyes, nasal congestion, postnasal drainage, and cough secondary to drainage.  States fever 2 days ago but none today.  Patient test positive in clinic for COVID-19.   Review of Systems Seasonal rhinitis    Objective:   Physical Exam BP 121/78  Cuff Size Large  Pulse Rate 88  Temp 98.6 F (37 C)  Temp Source Temporal  Weight 200 lb (90.7 kg)  Height 5\' 9"  (1.753 m)  Resp 16  HEENT remarkable for erythematous conjunctiva, edematous nasal turbinates, postnasal drainage, and erythematous pharynx. Neck is supple for lymphadenopathy or bruits. Lungs are clear to auscultation. Heart is regular rate and rhythm. Positive COVID-19 test.       Assessment & Plan: COVID-19  Patient given prescription for Paxlovid, Bromfed-DM, Naphcon eyedrops, and ibuprofen.  Patient given a work note for 2 days.  Vies return back if worsening of condition.

## 2023-10-30 NOTE — Progress Notes (Signed)
 Sneezing, eyes constantly watering itching, congestion, drainage, cough x 10/12/24. Friday being the worst day w/slight fever per patient. Zyrtec and benadryl does not work. The benadryl only works at night. Gretel Acre

## 2023-11-20 ENCOUNTER — Ambulatory Visit: Payer: Self-pay

## 2023-11-20 DIAGNOSIS — Z23 Encounter for immunization: Secondary | ICD-10-CM

## 2023-11-20 NOTE — Progress Notes (Signed)
 Called [facility] requesting a Tdap.  States she scratched her right thumb.  States she cleaned it right away.  Said her Tdap is outdated - last one was 2014.

## 2023-12-19 DIAGNOSIS — T7840XA Allergy, unspecified, initial encounter: Secondary | ICD-10-CM | POA: Insufficient documentation

## 2023-12-20 ENCOUNTER — Ambulatory Visit: Payer: Self-pay

## 2023-12-20 DIAGNOSIS — Z0000 Encounter for general adult medical examination without abnormal findings: Secondary | ICD-10-CM

## 2023-12-20 LAB — POCT URINALYSIS DIPSTICK
Bilirubin, UA: NEGATIVE
Blood, UA: NEGATIVE
Glucose, UA: NEGATIVE
Ketones, UA: NEGATIVE
Leukocytes, UA: NEGATIVE
Nitrite, UA: NEGATIVE
Protein, UA: POSITIVE — AB
Spec Grav, UA: 1.02 (ref 1.010–1.025)
Urobilinogen, UA: 0.2 U/dL
pH, UA: 6 (ref 5.0–8.0)

## 2023-12-21 LAB — CMP12+LP+TP+TSH+6AC+CBC/D/PLT
ALT: 14 IU/L (ref 0–32)
AST: 18 IU/L (ref 0–40)
Albumin: 4.6 g/dL (ref 3.9–4.9)
Alkaline Phosphatase: 71 IU/L (ref 44–121)
BUN/Creatinine Ratio: 12 (ref 9–23)
BUN: 11 mg/dL (ref 6–20)
Basophils Absolute: 0 10*3/uL (ref 0.0–0.2)
Basos: 1 %
Bilirubin Total: 0.3 mg/dL (ref 0.0–1.2)
Calcium: 9.3 mg/dL (ref 8.7–10.2)
Chloride: 98 mmol/L (ref 96–106)
Chol/HDL Ratio: 3 ratio (ref 0.0–4.4)
Cholesterol, Total: 210 mg/dL — ABNORMAL HIGH (ref 100–199)
Creatinine, Ser: 0.9 mg/dL (ref 0.57–1.00)
EOS (ABSOLUTE): 0.1 10*3/uL (ref 0.0–0.4)
Eos: 3 %
Estimated CHD Risk: <0.5 times avg. (ref 0.0–1.0)
Free Thyroxine Index: 1.6 (ref 1.2–4.9)
GGT: 50 IU/L (ref 0–60)
Globulin, Total: 2.3 g/dL (ref 1.5–4.5)
Glucose: 92 mg/dL (ref 70–99)
HDL: 71 mg/dL (ref >39)
Hematocrit: 40.5 % (ref 34.0–46.6)
Hemoglobin: 13.3 g/dL (ref 11.1–15.9)
Immature Grans (Abs): 0 10*3/uL (ref 0.0–0.1)
Immature Granulocytes: 0 %
Iron: 74 ug/dL (ref 27–159)
LDH: 141 IU/L (ref 119–226)
LDL Chol Calc (NIH): 121 mg/dL — ABNORMAL HIGH (ref 0–99)
Lymphocytes Absolute: 1.7 10*3/uL (ref 0.7–3.1)
Lymphs: 41 %
MCH: 28.2 pg (ref 26.6–33.0)
MCHC: 32.8 g/dL (ref 31.5–35.7)
MCV: 86 fL (ref 79–97)
Monocytes Absolute: 0.4 10*3/uL (ref 0.1–0.9)
Monocytes: 11 %
Neutrophils Absolute: 1.8 10*3/uL (ref 1.4–7.0)
Neutrophils: 44 %
Phosphorus: 3.4 mg/dL (ref 3.0–4.3)
Platelets: 289 10*3/uL (ref 150–450)
Potassium: 4.2 mmol/L (ref 3.5–5.2)
RBC: 4.71 x10E6/uL (ref 3.77–5.28)
RDW: 12.5 % (ref 11.7–15.4)
Sodium: 135 mmol/L (ref 134–144)
T3 Uptake Ratio: 26 % (ref 24–39)
T4, Total: 6.2 ug/dL (ref 4.5–12.0)
TSH: 1.52 u[IU]/mL (ref 0.450–4.500)
Total Protein: 6.9 g/dL (ref 6.0–8.5)
Triglycerides: 100 mg/dL (ref 0–149)
Uric Acid: 5.7 mg/dL (ref 2.6–6.2)
VLDL Cholesterol Cal: 18 mg/dL (ref 5–40)
WBC: 4.1 10*3/uL (ref 3.4–10.8)
eGFR: 88 mL/min/{1.73_m2} (ref >59)

## 2023-12-27 ENCOUNTER — Ambulatory Visit: Payer: Self-pay | Admitting: Physician Assistant

## 2023-12-27 ENCOUNTER — Encounter: Payer: Self-pay | Admitting: Physician Assistant

## 2023-12-27 VITALS — BP 133/83 | HR 77 | Resp 14 | Ht 69.0 in | Wt 200.0 lb

## 2023-12-27 DIAGNOSIS — M25531 Pain in right wrist: Secondary | ICD-10-CM

## 2023-12-27 DIAGNOSIS — Z0000 Encounter for general adult medical examination without abnormal findings: Secondary | ICD-10-CM

## 2023-12-27 NOTE — Progress Notes (Signed)
 Pt presents today for workers comp injury Initial visit. DOI 12/19/23. 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 Pt describes the pain as aches.

## 2023-12-27 NOTE — Progress Notes (Signed)
 Pt presents today to complete physical. Pt only concerned with cholesterol levels. Hayley Avila

## 2023-12-27 NOTE — Progress Notes (Signed)
   Subjective: Right wrist pain    Patient ID: Hayley Avila, female    DOB: 04-Dec-1990, 33 y.o.   MRN: [id number]  HPI Patient follow-up for right wrist pain which occurred 8 days ago during a training exercise.  Patient states wrist was hyperflexed during extreme exercise resulted in mild to moderate pain which has improved.  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  Needs return to work evaluation.   Review of Systems Negative septa above complaint    Objective:   Physical Exam BP 133/83  Cuff Size Large  Pulse Rate 77  Weight 200 lb (90.7 kg)  Height 5\' 9"  (1.753 m)  Resp 14  SpO2 98 %   BMI: 29.53 kg/m2  BSA: 2.10 m2  Patient is right-hand dominant.  No obvious deformity to the right wrist/hand.  Neurovascular intact.  Full and equal range of motion.  Equal grip strength.       Assessment & Plan: Right wrist pain  No acute findings on patient complaint.  May return back to full duties.

## 2023-12-27 NOTE — Progress Notes (Addendum)
 [facility]   ____________________________________________   None    (approximate)  I have reviewed the triage vital signs and the nursing notes.   HISTORY  Chief Complaint No chief complaint on file.   HPI Hayley Avila is a 33 y.o. female patient presents for annual physical exam.  Complaining of right wrist pain secondary to a hyperflexion at training incident 8 days ago.  States able to perform all duties.         Past Medical History:  Diagnosis Date   Asthma    Heart murmur    S/P ACL surgery 2018    Patient Active Problem List   Diagnosis Date Noted   Allergies 12/19/2023   Pain in joint of right knee 12/20/2021   Contusion of right knee 03/20/2019   Knee pain 03/18/2019   Sprain of medial collateral ligament of right knee 03/18/2019   History of repair of ACL 03/18/2019    Past Surgical History:  Procedure Laterality Date   ARTHROSCOPIC REPAIR ACL Right 2018   EYE SURGERY Right 2009   Cyst removed from schlers    Prior to Admission medications   Medication Sig Start Date End Date Taking? Authorizing Provider  albuterol (VENTOLIN HFA) 108 (90 Base) MCG/ACT inhaler Inhale 2 puffs into the lungs every 6 (six) hours as needed for wheezing or shortness of breath.    [provider]  brompheniramine-pseudoephedrine-DM 30-2-10 MG/5ML syrup Take 5 mLs by mouth 4 (four) times daily as needed. 10/30/23   Claudene Tanda POUR, PA-C  clonazePAM (KLONOPIN) 0.5 MG tablet Take 0.5 mg by mouth at bedtime. 06/20/23   [provider]  ibuprofen  (ADVIL ) 800 MG tablet Take 1 tablet (800 mg total) by mouth every 8 (eight) hours as needed for moderate pain (pain score 4-6). 10/30/23   Claudene Tanda POUR, PA-C  naphazoline-pheniramine (NAPHCON-A) 0.025-0.3 % ophthalmic solution Place 1 drop into both eyes 4 (four) times daily as needed for eye irritation. 10/30/23   Claudene Tanda POUR, PA-C    Allergies Peanut oil and Celery  oil  Family History  Problem Relation Age of Onset   Hypertension Mother    Hypertension Father    Multiple sclerosis Sister    Colon cancer Maternal Grandmother     Social History Social History   Tobacco Use   Smoking status: Never   Smokeless tobacco: Never  Vaping Use   Vaping status: Former  Substance Use Topics   Alcohol use: Yes   Drug use: Never    Review of Systems Constitutional: No fever/chills Eyes: No visual changes. ENT: No sore throat. Cardiovascular: Denies chest pain. Respiratory: Denies shortness of breath.  Asthma. Gastrointestinal: No abdominal pain.  No nausea, no vomiting.  No diarrhea.  No constipation. Genitourinary: Negative for dysuria. Musculoskeletal: Negative for back pain. Skin: Negative for rash. Neurological: Negative for headaches, focal weakness or numbness. Allergic/Immunilogical: Peanut oil. ____________________________________________   PHYSICAL EXAM:  VITAL SIGNS:  Constitutional: Alert and oriented. Well appearing and in no acute distress. Eyes: Conjunctivae are normal. PERRL. EOMI. Head: Atraumatic. Nose: No congestion/rhinnorhea. Mouth/Throat: Mucous membranes are moist.  Oropharynx non-erythematous. Neck: No stridor.  No cervical spine tenderness to palpation. Hematological/Lymphatic/Immunilogical: No cervical lymphadenopathy. Cardiovascular: Normal rate, regular rhythm. Grossly normal heart sounds.  Good peripheral circulation. Respiratory: Normal respiratory effort.  No retractions. Lungs CTAB. Gastrointestinal: Soft and nontender. No distention. No abdominal bruits. No CVA tenderness. Genitourinary: Deferred Musculoskeletal: No lower extremity tenderness nor edema.  No joint effusions.  Neurologic:  Normal speech and language. No gross focal neurologic deficits are appreciated. No gait instability. Skin:  Skin is warm, dry and intact. No rash noted. Psychiatric: Mood and affect are normal. Speech and behavior are  normal.  ____________________________________________   LABS _       Component Ref Range & Units (hover) 7 d ago 12 mo ago 1 yr ago 4 yr ago  Color, UA Amber Yellow Dark Yellow Yellow  Clarity, UA Clear Clear Clear Clear  Glucose, UA Negative Negative Negative Negative  Bilirubin, UA Negative Negative Negative Negative  Ketones, UA Negative Negative Negative Negative  Spec Grav, UA 1.020 1.015 >=1.030 Abnormal  1.020  Blood, UA Negative Positive CM Positive CM Postive CM  pH, UA 6.0 6.0 6.0 6.0  Protein, UA Positive Abnormal  Negative Positive Abnormal  CM Negative  Comment: 1+  Urobilinogen, UA 0.2 0.2 0.2 0.2  Nitrite, UA Negative Negative Negative Negative  Leukocytes, UA Negative Negative Negative Negative  Appearance      Odor                    Component Ref Range & Units (hover) 7 d ago (12/20/23) 12 mo ago (12/28/22) 1 yr ago (01/13/22) 2 yr ago (05/07/21) 2 yr ago (05/07/21) 2 yr ago (05/07/21) 2 yr ago (01/01/21)  Glucose 92 92 93  108 High  CM  98 R  Uric Acid 5.7 5.9 CM 6.1 CM    4.6 CM  Comment:            Therapeutic target for gout patients: <6.0  BUN 11 9 10  11  9   Creatinine, Ser 0.90 1.00 0.96  0.94 R  0.83  eGFR 88 78 82    98  BUN/Creatinine Ratio 12 9 10    11   Sodium 135 138 139  136 R  138  Potassium 4.2 4.4 4.2  3.5 R  4.1  Chloride 98 102 102  101 R  104  Calcium  9.3 9.3 9.4  9.4 R  9.5  Phosphorus 3.4 3.1 3.2    3.9  Total Protein 6.9 6.7 7.4    6.8  Albumin 4.6 4.4 R 4.9 R    4.7 R  Globulin, Total 2.3 2.3 2.5    2.1  Bilirubin Total 0.3 0.3 <0.2    0.3  Alkaline Phosphatase 71 70 75    64  LDH 141 133 136    134  AST 18 15 20    15   ALT 14 10 13    11   GGT 50 44 70 High     48  Iron 74 86 59    75  Cholesterol, Total 210 High  213 High  236 High     202 High   Triglycerides 100 87 194 High     100  HDL 71 74 73    71  VLDL Cholesterol Cal 18 15 34    18  LDL Chol Calc (NIH) 121 High  124 High  129 High     113 High   Chol/HDL Ratio  3.0 2.9 CM 3.2 CM    2.8 CM  Comment:                                   T. Chol/HDL Ratio  Men  Women                               1/2 Avg.Risk  3.4    3.3                                   Avg.Risk  5.0    4.4                                2X Avg.Risk  9.6    7.1                                3X Avg.Risk 23.4   11.0  Estimated CHD Risk  < 0.5  < 0.5 CM  < 0.5 CM     < 0.5 CM  Comment: The CHD Risk is based on the T. Chol/HDL ratio. Other factors affect CHD Risk such as hypertension, smoking, diabetes, severe obesity, and family history of premature CHD.  TSH 1.520 2.240 4.760 High  5.060 High  R, CM   3.900  T4, Total 6.2 5.7 6.7    6.0  T3 Uptake Ratio 26 24 28    27   Free Thyroxine Index 1.6 1.4 1.9    1.6  WBC 4.1 4.1 4.0   5.7 R 4.0  RBC 4.71 4.67 4.89   4.77 R 4.62  Hemoglobin 13.3 13.1 14.0   13.9 R 12.9  Hematocrit 40.5 39.8 41.3   40.6 R 38.3  MCV 86 85 85   85.1 R 83  MCH 28.2 28.1 28.6   29.1 R 27.9  MCHC 32.8 32.9 33.9   34.2 R 33.7  RDW 12.5 12.6 12.8   12.4 R 12.2  Platelets 289 280 298   269 R 274  Neutrophils 44 52 43    43  Lymphs 41 36 44    43  Monocytes 11 9 9    10   Eos 3 2 3    3   Basos 1 1 1    1   Neutrophils Absolute 1.8 2.1 1.7    1.7  Lymphocytes Absolute 1.7 1.5 1.8    1.7  Monocytes Absolute 0.4 0.4 0.4    0.4  EOS (ABSOLUTE) 0.1 0.1 0.1    0.1  Basophils Absolute 0.0 0.0 0.1    0.0  Immature Granulocytes 0 0 0    0  Immature Grans (Abs) 0.0 0.0 0.0    0.0                  ___________________________________________  EKG  Sinus rhythm at 73 bpm ____________________________________________    ____________________________________________   INITIAL IMPRESSION / ASSESSMENT AND PLAN  As part of my medical decision making, I reviewed the following data within the electronic MEDICAL RECORD NUMBER Notes from prior ED visits and [place] Controlled Substance Database     No acute findings on physical exam,  EKG, labs.        ____________________________________________   FINAL CLINICAL IMPRESSION Well exam   ED Discharge Orders     None        Note:  This document was prepared using Dragon voice recognition software and may include unintentional dictation errors.

## 2024-01-17 ENCOUNTER — Ambulatory Visit: Admitting: Physician Assistant

## 2024-01-18 ENCOUNTER — Ambulatory Visit: Payer: Self-pay | Admitting: Physician Assistant

## 2024-01-18 ENCOUNTER — Encounter: Payer: Self-pay | Admitting: Physician Assistant

## 2024-01-18 VITALS — BP 130/86 | HR 80 | Temp 97.5°F | Resp 14

## 2024-01-18 DIAGNOSIS — M25531 Pain in right wrist: Secondary | ICD-10-CM

## 2024-01-18 NOTE — Progress Notes (Addendum)
 Stated during former training experience on 12/19/23 reportedly, she felt discomfort with her right wrist.  She hasn't been able to really rest it and brings a wrist splint along that she bought that hasn't been very helpful.  Reports at rest no pain, but with using it feels discomfort when moving it in a flexion movement or with typing.  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

## 2024-01-18 NOTE — Progress Notes (Signed)
   Subjective: Right wrist pain    Patient ID: Hayley Avila, female    DOB: 09-Oct-1990, 33 y.o.   MRN: [id number]  HPI Patient complaining of right wrist discomfort for the past few weeks.  Onset of complaint was doing a training exercise when her wrist was hyperflexed.  Patient states full and equal  range of motion but discomfort with increased flexion.  Patient states she is able to perform her duties and able to fire her weapon.  Voices concern for duration of discomfort.   Review of Systems Negative except for above complaint    Objective:   Physical Exam BP 130/86  Pulse Rate 80  Temp 97.5 F (36.4 C)  Resp 14  SpO2 95 %  No obvious deformity to the right wrist.  No edema, erythema, ecchymosis.  Full and equal range of motion with complaint of discomfort with flexion.  Neurovascular intact.       Assessment & Plan: Right wrist pain  Advised light duty pending evaluation by occupational therapist.  Advised Wear wrist splint while working.

## 2024-01-18 NOTE — Addendum Note (Signed)
 Addended by: Katha Palau on: 01/18/2024 09:18 AM   Modules accepted: Orders

## 2024-01-18 NOTE — Progress Notes (Signed)
 Faxed rehab referral along with office notes to [facility] at [phone].

## 2024-01-26 ENCOUNTER — Ambulatory Visit: Attending: Physician Assistant | Admitting: Occupational Therapy

## 2024-01-26 DIAGNOSIS — M6281 Muscle weakness (generalized): Secondary | ICD-10-CM | POA: Diagnosis present

## 2024-01-26 DIAGNOSIS — M25531 Pain in right wrist: Secondary | ICD-10-CM | POA: Insufficient documentation

## 2024-01-26 NOTE — Therapy (Unsigned)
 OUTPATIENT OCCUPATIONAL THERAPY ORTHO EVALUATION  Patient Name: Hayley Avila MRN: [id number] DOB:01/21/1991, 33 y.o., female Today's Date: 01/26/2024  PCP: *** REFERRING PROVIDER: ***  END OF SESSION:   Past Medical History:  Diagnosis Date   Asthma    Heart murmur    S/P ACL surgery 2018   Past Surgical History:  Procedure Laterality Date   ARTHROSCOPIC REPAIR ACL Right 2018   EYE SURGERY Right 2009   Cyst removed from schlers   Patient Active Problem List   Diagnosis Date Noted   Allergies 12/19/2023   Pain in joint of right knee 12/20/2021   Contusion of right knee 03/20/2019   Knee pain 03/18/2019   Sprain of medial collateral ligament of right knee 03/18/2019   History of repair of ACL 03/18/2019    ONSET DATE: ***  REFERRING DIAG: ***  THERAPY DIAG:  No diagnosis found.  Rationale for Evaluation and Treatment: {HABREHAB:27488}  SUBJECTIVE:   SUBJECTIVE STATEMENT: *** Pt accompanied by: {accompnied:27141}  PERTINENT HISTORY: ***  PRECAUTIONS: {Therapy precautions:24002}  RED FLAGS: {PT Red Flags:29287}   WEIGHT BEARING RESTRICTIONS: {Yes ***/No:24003}  PAIN:  Are you having pain? {OPRCPAIN:27236}  FALLS: Has patient fallen in last 6 months? {fallsyesno:27318}  LIVING ENVIRONMENT: Lives with: {OPRC lives with:25569::lives with their family} Lives in: {Lives in:25570} Stairs: {opstairs:27293} Has following equipment at home: {Assistive devices:23999}  PLOF: {PLOF:24004}  PATIENT GOALS: ***  NEXT MD VISIT: ***  OBJECTIVE:  Note: Objective measures were completed at Evaluation unless otherwise noted.  HAND DOMINANCE: {MISC; OT HAND DOMINANCE:5798140406}  ADLs: {ADLs OT:31716}  FUNCTIONAL OUTCOME MEASURES: {OTFUNCTIONALMEASURES:27238}  UPPER EXTREMITY ROM:     {AROM/PROM:27142} ROM Right eval Left eval  Shoulder flexion    Shoulder abduction    Shoulder adduction    Shoulder extension    Shoulder internal rotation     Shoulder external rotation    Elbow flexion    Elbow extension    Wrist flexion    Wrist extension    Wrist ulnar deviation    Wrist radial deviation    Wrist pronation    Wrist supination    (Blank rows = not tested)  {AROM/PROM:27142} ROM Right eval Left eval  Thumb MCP (0-60)    Thumb IP (0-80)    Thumb Radial abd/add (0-55)     Thumb Palmar abd/add (0-45)     Thumb Opposition to Small Finger     Index MCP (0-90)     Index PIP (0-100)     Index DIP (0-70)      Long MCP (0-90)      Long PIP (0-100)      Long DIP (0-70)      Ring MCP (0-90)      Ring PIP (0-100)      Ring DIP (0-70)      Little MCP (0-90)      Little PIP (0-100)      Little DIP (0-70)      (Blank rows = not tested)   UPPER EXTREMITY MMT:     MMT Right eval Left eval  Shoulder flexion    Shoulder abduction    Shoulder adduction    Shoulder extension    Shoulder internal rotation    Shoulder external rotation    Middle trapezius    Lower trapezius    Elbow flexion    Elbow extension    Wrist flexion    Wrist extension    Wrist ulnar deviation    Wrist radial deviation  Wrist pronation    Wrist supination    (Blank rows = not tested)  HAND FUNCTION: {handfunction:27230}  COORDINATION: {otcoordination:27237}  SENSATION: {sensation:27233}  EDEMA: ***  COGNITION: Overall cognitive status: {cognition:24006} Areas of impairment: {impairedcognition:27234}  OBSERVATIONS: ***   TREATMENT DATE: ***                                                                                                                            Modalities: {OPRCMODALITIES:31717}     PATIENT EDUCATION: Education details: *** Person educated: {Person educated:25204} Education method: {Education Method:25205} Education comprehension: {Education Comprehension:25206}  HOME EXERCISE PROGRAM: ***  GOALS: Goals reviewed with patient? {yes/no:20286}  SHORT TERM GOALS: Target date:  ***  *** Baseline: Goal status: INITIAL  2.  *** Baseline:  Goal status: INITIAL  3.  *** Baseline:  Goal status: INITIAL  4.  *** Baseline:  Goal status: INITIAL  5.  *** Baseline:  Goal status: INITIAL  6.  *** Baseline:  Goal status: INITIAL  LONG TERM GOALS: Target date: ***  *** Baseline:  Goal status: INITIAL  2.  *** Baseline:  Goal status: INITIAL  3.  *** Baseline:  Goal status: INITIAL  4.  *** Baseline:  Goal status: INITIAL  5.  *** Baseline:  Goal status: INITIAL  6.  *** Baseline:  Goal status: INITIAL  ASSESSMENT:  CLINICAL IMPRESSION: Patient is a *** y.o. *** who was seen today for occupational therapy evaluation for ***.   PERFORMANCE DEFICITS: in functional skills including {OT physical skills:25468}, cognitive skills including {OT cognitive skills:25469}, and psychosocial skills including {OT psychosocial skills:25470}.   IMPAIRMENTS: are limiting patient from {OT performance deficits:25471}.   COMORBIDITIES: {Comorbidities:25485} that affects occupational performance. Patient will benefit from skilled OT to address above impairments and improve overall function.  MODIFICATION OR ASSISTANCE TO COMPLETE EVALUATION: {OT modification:25474}  OT OCCUPATIONAL PROFILE AND HISTORY: {OT PROFILE AND HISTORY:25484}  CLINICAL DECISION MAKING: {OT CDM:25475}  REHAB POTENTIAL: {rehabpotential:25112}  EVALUATION COMPLEXITY: {Evaluation complexity:25115}      PLAN:  OT FREQUENCY: {rehab frequency:25116}  OT DURATION: {rehab duration:25117}  PLANNED INTERVENTIONS: {OT Interventions:25467}  RECOMMENDED OTHER SERVICES: ***  CONSULTED AND AGREED WITH PLAN OF CARE: {ENR:74513}  PLAN FOR NEXT SESSION: ***   Agostino Gorin, OT 01/26/2024, 9:08 AM

## 2024-01-27 NOTE — Therapy (Signed)
 OUTPATIENT OCCUPATIONAL THERAPY ORTHO EVALUATION  Patient Name: Hayley Avila MRN: [id number] DOB:July 01, 1991, 33 y.o., female Today's Date: 01/30/2024  PCP: none listed REFERRING PROVIDER: Claudene Ingle, MARLA PA-C  END OF SESSION:  OT End of Session - 01/30/24 0643     Visit Number 1    Number of Visits 13    Date for OT Re-Evaluation 03/08/24    OT Start Time 0900    OT Stop Time 0945    OT Time Calculation (min) 45 min    Activity Tolerance Patient tolerated treatment well    Behavior During Therapy [facility] for tasks assessed/performed          Past Medical History:  Diagnosis Date   Asthma    Heart murmur    S/P ACL surgery 2018   Past Surgical History:  Procedure Laterality Date   ARTHROSCOPIC REPAIR ACL Right 2018   EYE SURGERY Right 2009   Cyst removed from [place]   Patient Active Problem List   Diagnosis Date Noted   Allergies 12/19/2023   Pain in joint of right knee 12/20/2021   Contusion of right knee 03/20/2019   Knee pain 03/18/2019   Sprain of medial collateral ligament of right knee 03/18/2019   History of repair of ACL 03/18/2019    ONSET DATE: 12/27/2023  REFERRING DIAG: right wrist pain  THERAPY DIAG:  Pain in right wrist  Muscle weakness (generalized)  Rationale for Evaluation and Treatment: Rehabilitation  SUBJECTIVE:   SUBJECTIVE STATEMENT: Pt reported right wrist pain on 12/27/2023 after engaging in a work training focusing on combat maneuvers and during one of the moves her wrist was bend into flexion resulting in increased pain and subsequently affecting her ability to perform daily tasks at home and work.  She has no pain at rest but increased pain 4/10 with select activities and anytime the wrist is moved into flexion towards end range.   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   Pt engaged in work training module with combat maneuvers involving hands on take down which  resulted in her right wrist being placed in hyperflexion resulting in increased pain over time.  She has been wearing a wrist brace on the right during activities at work and home but the pain has not changed or lessened over time.  PRECAUTIONS: Other: light duty, no suspect contact, limit lifting and pulling motions  RED FLAGS: None   WEIGHT BEARING RESTRICTIONS: Yes    PAIN:  Are you having pain? Yes: NPRS scale: no pain at rest but increased pain with wrist in flexion 4/10 Pain location: right wrist in flexion Pain description: aching, pulling Aggravating factors: wrist in flexion Relieving factors: rest  FALLS: Has patient fallen in last 6 months? No  LIVING ENVIRONMENT: Lives with: lives with their spouse Lives in: House/apartment Has following equipment at home: None  PLOF: Independent  PATIENT GOALS: Pt  would like to return to previous level of full independence at home and work  NEXT MD VISIT: TBA  OBJECTIVE:  Note: Objective measures were completed at Evaluation unless otherwise noted.  HAND DOMINANCE: Right  ADLs: Overall ADLs: Pt currently wearing wrist brace for support during all self care, IADL and work tasks.  She removes for shower and also sleeps with brace.  Pt reports she has difficulty with opening containers, taking off her bra, grabbing items, tying shoes, performing hair care, pulling up pants, cooking, laundry and being able to  work out with [employer].  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  She has difficulty with opening doors, light pain with writing.  She denies any issues with typing at the moment, able to manage her duty weapon when wearing her brace and has changed her gun holster to increase ease of removing weapon.    FUNCTIONAL OUTCOME MEASURES: TBA  UPPER EXTREMITY ROM:     Active ROM Right eval Left eval  Shoulder  flexion WNL WNL  Shoulder abduction WNL WNL  Shoulder adduction    Shoulder extension    Shoulder internal rotation WNL WNL  Shoulder external rotation WNL WNL  Elbow flexion WNL WNL  Elbow extension WNL WNL  Wrist flexion 74 80  Wrist extension 40 60  Wrist ulnar deviation 20 WNL  Wrist radial deviation 18 WNL  Wrist pronation 90 WNL  Wrist supination 90 WNL  (Blank rows = not tested)  Full fisting on right and able to demonstrate oppositional movements to small finger and base of small finger.   UPPER EXTREMITY MMT:     MMT Right eval Left eval  Shoulder flexion WNL WNL  Shoulder abduction WNL WNL  Shoulder adduction    Shoulder extension    Shoulder internal rotation WNL WNL  Shoulder external rotation WNL WNL  Middle trapezius    Lower trapezius    Elbow flexion WNL WNL  Elbow extension WNL WNL  Wrist flexion 4/5 5/5  Wrist extension 4/5 5/5  Wrist ulnar deviation 4/5 5/5  Wrist radial deviation 4/5 5/5  Wrist pronation 5/5 5/5  Wrist supination 5/5 5/5  (Blank rows = not tested)  HAND FUNCTION: Grip strength: Right: 38 lbs; Left: 56 lbs, Lateral pinch: Right: 12 lbs, Left: 18 lbs, and 3 point pinch: Right: 12 lbs, Left: 19 lbs  COORDINATION: WFL  SENSATION: WFL  EDEMA: mild edema noted in dorsum of right wrist, also along MPs of the hand.  Circumferential measurements of right wrist compared to left is normal and symmetrical.  Pt reports increased edema with increased use at times and reports increased edema yesterday.    COGNITION: Overall cognitive status: Within functional limits for tasks assessed   TREATMENT DATE: 01/26/2024                                                                                                                            Pt was educated on use of contrast at home 3 times a day, alternating hot and cold with 2-3 mins hot and 1 min cold for 15-20 mins.   End with warm and perform ROM and stretches.  Tendon glides, wrist  flexion/extension, ulnar and radial deviation.  PATIENT EDUCATION: Education details: contrast for edema control, ROM exercises Person educated: Patient Education method: Explanation Education comprehension: verbalized understanding  HOME EXERCISE PROGRAM: See above  GOALS: Goals reviewed with  patient? Yes  SHORT TERM GOALS: Target date: 02/09/2024  Pt will demonstrate understanding of use of contrast for home program for edema control of right wrist.  Baseline: no knowledge at eval Goal status: INITIAL  LONG TERM GOALS: Target date: 03/08/2024  Pt will demonstrate home exercise program with modified independence Baseline: no current program Goal status: INITIAL  2.  Pt will demonstrate ability to open jars and containers independently with pain 2/10 or less. Baseline: difficulty at eval and pain noted. Goal status: INITIAL  3.  Pt will demonstrate ability to complete self care tasks with modified independence with pain 2/10 or less.   Baseline: difficulty with hair care, tying shoes, pulling up pants and taking off bra. Goal status: INITIAL  4.  Pt will demonstrate ability to open and close doors at work and home independently and pain 2/10 or less.  Baseline: difficulty with opening doors at work, increased pain when performing Goal status: INITIAL  5.    Pt will return to full duty work tasks including suspect contact independently and safely without difficulty or pain in wrist.  Baseline:  pt currently on light duty and no suspect contact. Goal status: INITIAL   ASSESSMENT:  CLINICAL IMPRESSION: Patient is a 33 y.o. female who was seen today for occupational therapy evaluation for right wrist pain. Pt denies pain today at rest however has increased pain with movement patterns towards wrist flexion, especially towards end range.  She also has pain with select daily activities such as opening containers, taking off her bra, grabbing items, tying shoes, performing hair  care, pulling up pants, cooking, laundry and being able to work out with weights.  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  She has difficulty with opening doors at work, light pain with writing.  She presents with right wrist pain, decreased strength and ROM.  She would benefit from skilled OT services to maximize her independence and safety with tasks at work, home and in the community.    PERFORMANCE DEFICITS: in functional skills including ADLs, IADLs, edema, ROM, strength, and pain, and psychosocial skills including environmental adaptation, habits, and routines and behaviors.   IMPAIRMENTS: are limiting patient from ADLs, IADLs, work, and leisure.   COMORBIDITIES: has no other co-morbidities that affects occupational performance. Patient will benefit from skilled OT to address above impairments and improve overall function.  MODIFICATION OR ASSISTANCE TO COMPLETE EVALUATION: No modification of tasks or assist necessary to complete an evaluation.  OT OCCUPATIONAL PROFILE AND HISTORY: Problem focused assessment: Including review of records relating to presenting problem.  CLINICAL DECISION MAKING: Moderate - several treatment options, min-mod task modification necessary  REHAB POTENTIAL: Excellent  EVALUATION COMPLEXITY: Low      PLAN:  OT FREQUENCY: 1-2x/week  OT DURATION: 6 weeks  PLANNED INTERVENTIONS: 97168 OT Re-evaluation, 97535 self care/ADL training, 02889 therapeutic exercise, 97530 therapeutic activity, 97112 neuromuscular re-education, 97035 ultrasound, 97018 paraffin, 02960 fluidotherapy, 97010 moist heat, 97010 cryotherapy, and 97034 contrast bath  RECOMMENDED OTHER SERVICES: none  CONSULTED AND AGREED WITH PLAN OF CARE: Patient  PLAN FOR NEXT SESSION: provide additional home exercises, work modifications as needed.   Annah Jasko T Ife Vitelli, OTR/L,  CLT 01/30/2024, 6:45 AM

## 2024-01-30 ENCOUNTER — Ambulatory Visit: Attending: Physician Assistant | Admitting: Occupational Therapy

## 2024-01-30 ENCOUNTER — Encounter: Payer: Self-pay | Admitting: Occupational Therapy

## 2024-01-30 DIAGNOSIS — M25531 Pain in right wrist: Secondary | ICD-10-CM | POA: Diagnosis present

## 2024-01-30 DIAGNOSIS — M6281 Muscle weakness (generalized): Secondary | ICD-10-CM | POA: Diagnosis present

## 2024-01-30 NOTE — Addendum Note (Signed)
 Addended by: SHERLON NO T on: 01/30/2024 07:39 AM   Modules accepted: Orders

## 2024-01-30 NOTE — Therapy (Signed)
 OUTPATIENT OCCUPATIONAL THERAPY ORTHO TREATMENT  Patient Name: Hayley Avila MRN: [id number] DOB:03/14/1991, 33 y.o., female Today's Date: 01/30/2024  PCP: none listed REFERRING PROVIDER: Claudene Ingle, MARLA PA-C  END OF SESSION:  OT End of Session - 01/30/24 1534     Visit Number 2    Number of Visits 13    Date for OT Re-Evaluation 03/08/24    OT Start Time 1534    OT Stop Time 1614    OT Time Calculation (min) 40 min    Activity Tolerance Patient tolerated treatment well    Behavior During Therapy [facility] for tasks assessed/performed           Past Medical History:  Diagnosis Date   Asthma    Heart murmur    S/P ACL surgery 2018   Past Surgical History:  Procedure Laterality Date   ARTHROSCOPIC REPAIR ACL Right 2018   EYE SURGERY Right 2009   Cyst removed from schlers   Patient Active Problem List   Diagnosis Date Noted   Allergies 12/19/2023   Pain in joint of right knee 12/20/2021   Contusion of right knee 03/20/2019   Knee pain 03/18/2019   Sprain of medial collateral ligament of right knee 03/18/2019   History of repair of ACL 03/18/2019    ONSET DATE: 12/27/2023  REFERRING DIAG: right wrist pain  THERAPY DIAG:  Pain in right wrist  Muscle weakness (generalized)  Rationale for Evaluation and Treatment: Rehabilitation  SUBJECTIVE:   SUBJECTIVE STATEMENT: The pain and motion is about the same.  The pain today was probably a 4-6 at the worst.  I had to do a combats thing this today MRSA to band.  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  She has been wearing a wrist brace on the right during activities at work and home but  the pain has not changed or lessened over time.  PRECAUTIONS: Other: light duty, no suspect contact, limit lifting and pulling motions  RED FLAGS: None   WEIGHT BEARING RESTRICTIONS: Yes    PAIN:  Are you having pain? 4-6/10 with wrist flexion - thumb volar wrist  FALLS: Has patient fallen in last 6 months? No  LIVING ENVIRONMENT: Lives with: lives with their spouse Lives in: House/apartment Has following equipment at home: None  PLOF: Independent  PATIENT GOALS: Pt  would like to return to previous level of full independence at home and work  NEXT MD VISIT: TBA  OBJECTIVE:  Note: Objective measures were completed at Evaluation unless otherwise noted.  HAND DOMINANCE: Right  ADLs: Overall ADLs: Pt currently wearing wrist brace for support during all self care, IADL and work tasks.  She removes for shower and also sleeps with brace.  Pt reports she has difficulty with opening containers, taking off her bra, grabbing items, tying shoes, performing hair care, pulling up pants, cooking, laundry and being able to work out with weights.  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  She has difficulty with opening doors, light pain with writing.  She denies any  issues with typing at the moment, able to manage her duty weapon when wearing her brace and has changed her gun holster to increase ease of removing weapon.    FUNCTIONAL OUTCOME MEASURES: TBA  UPPER EXTREMITY ROM:     Active ROM Right eval Left eval R 01/30/24  Shoulder flexion WNL WNL   Shoulder abduction WNL WNL   Shoulder adduction     Shoulder extension     Shoulder internal rotation WNL WNL   Shoulder external rotation WNL WNL   Elbow flexion WNL WNL   Elbow extension WNL WNL   Wrist flexion 74 80 80  Wrist extension 40 60 55/ 60 close fist  Wrist ulnar deviation 20 WNL 30  Wrist radial deviation 18  WNL 20  Wrist pronation 90 WNL 90  Wrist supination 90 WNL 90  (Blank rows = not tested)  Full fisting on right and able to demonstrate oppositional movements to small finger and base of small finger.   UPPER EXTREMITY MMT:     MMT Right eval Left eval  Shoulder flexion WNL WNL  Shoulder abduction WNL WNL  Shoulder adduction    Shoulder extension    Shoulder internal rotation WNL WNL  Shoulder external rotation WNL WNL  Middle trapezius    Lower trapezius    Elbow flexion WNL WNL  Elbow extension WNL WNL  Wrist flexion 4/5 5/5  Wrist extension 4/5 5/5  Wrist ulnar deviation 4/5 5/5  Wrist radial deviation 4/5 5/5  Wrist pronation 5/5 5/5  Wrist supination 5/5 5/5  (Blank rows = not tested)  HAND FUNCTION: Grip strength: Right: 38 lbs; Left: 56 lbs, Lateral pinch: Right: 12 lbs, Left: 18 lbs, and 3 point pinch: Right: 12 lbs, Left: 19 lbs  COORDINATION: WFL  SENSATION: WFL  EDEMA: mild edema noted in dorsum of right wrist, also along MPs of the hand.  Circumferential measurements of right wrist compared to left is normal and symmetrical.  Pt reports increased edema with increased use at times and reports increased edema yesterday.    COGNITION: Overall cognitive status: Within functional limits for tasks assessed   TREATMENT DATE: 01/30/2024         Patient arrived with continues increased edema over the wrist. Pain mostly with wrist flexion with resistance on volar wrist Pain worse with open hand and close fist. Decreased wrist extension but no pain Ulnar radial deviation and supination pronation pain-free as well as with resistance. Some discomfort at the volar wrist with resistance to thumb radial abduction As well as flexion of the thumb with some discomfort at the volar wrist                                                                                                                     Fluidotherapy done for 8 minutes with 2 rotations of ice for 1  minute to increase motion but also decrease pain and edema and stiffness prior to review of home exercises.  Afterwards patient with increased  wrist flexion extension with loose fist. Add to home exercises after she does 2-3 times a day contrast Keep wrist flexion extension pain-free 12 reps loose fist Supination pronation as well as radial ulnar deviation 12 reps pain-free Tendon glides 10 reps pain-free Patient fitted with a neoprene Benik wrap to wear with light activities to increase motion but still decreased pain. Patient to wear this hard brace with heavy activities or activities that causes pain. PATIENT EDUCATION: Education details: contrast for edema control, ROM exercises Person educated: Patient Education method: Explanation Education comprehension: verbalized understanding   GOALS: Goals reviewed with patient? Yes  SHORT TERM GOALS: Target date: 02/09/2024  Pt will demonstrate understanding of use of contrast for home program for edema control of right wrist.  Baseline: no knowledge at eval Goal status: INITIAL  LONG TERM GOALS: Target date: 03/08/2024  Pt will demonstrate home exercise program with modified independence Baseline: no current program Goal status: INITIAL  2.  Pt will demonstrate ability to open jars and containers independently with pain 2/10 or less. Baseline: difficulty at eval and pain noted. Goal status: INITIAL  3.  Pt will demonstrate ability to complete self care tasks with modified independence with pain 2/10 or less.   Baseline: difficulty with hair care, tying shoes, pulling up pants and taking off bra. Goal status: INITIAL  4.  Pt will demonstrate ability to open and close doors at work and home independently and pain 2/10 or less.  Baseline: difficulty with opening doors at work, increased pain when performing Goal status: INITIAL  5.    Pt will return to full duty work tasks including suspect contact independently and safely without  difficulty or pain in wrist.  Baseline:  pt currently on light duty and no suspect contact. Goal status: INITIAL   ASSESSMENT:  CLINICAL IMPRESSION: Patient is a 33 y.o. female who was seen today for occupational therapy for right wrist pain. Pt denies pain today at rest however has increased pain with movement patterns towards wrist flexion-patient decreased wrist extension but pain-free.  Patient also with some pain on the volar wrist with thumb flexion and radial abduction with resistance.  She also has pain with select daily activities such as opening containers, taking off her bra, grabbing items, tying shoes, performing hair care, pulling up pants, cooking, laundry and being able to work out with weights.  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  She has difficulty with opening doors at work, light pain with writing.  NOW patient home exercises was updated to contrast followed by loose fist active range of motion for wrist flexion extension pain-free 10-12 reps.  Tendon glides.  And fitted with a Benik neoprene wrist wrap to use with light activities to allow more motion but still decreased pain.  Hard brace with heavy or painful activities.  She presents with right wrist pain, decreased strength and ROM.  She would benefit from skilled OT services to maximize her independence and safety with tasks at work, home and in the community.    PERFORMANCE DEFICITS: in functional skills including ADLs, IADLs, edema, ROM, strength, and pain, and psychosocial skills including environmental adaptation, habits, and routines and behaviors.   IMPAIRMENTS: are limiting patient from ADLs, IADLs, work, and leisure.   COMORBIDITIES: has no other co-morbidities that affects occupational performance. Patient will benefit from skilled OT to address above impairments and improve  overall  function.  MODIFICATION OR ASSISTANCE TO COMPLETE EVALUATION: No modification of tasks or assist necessary to complete an evaluation.  OT OCCUPATIONAL PROFILE AND HISTORY: Problem focused assessment: Including review of records relating to presenting problem.  CLINICAL DECISION MAKING: Moderate - several treatment options, min-mod task modification necessary  REHAB POTENTIAL: Excellent  EVALUATION COMPLEXITY: Low      PLAN:  OT FREQUENCY: 1-2x/week  OT DURATION: 6 weeks  PLANNED INTERVENTIONS: 97168 OT Re-evaluation, 97535 self care/ADL training, 02889 therapeutic exercise, 97530 therapeutic activity, 97112 neuromuscular re-education, 97035 ultrasound, 97018 paraffin, 02960 fluidotherapy, 97010 moist heat, 97010 cryotherapy, and 97034 contrast bath    CONSULTED AND AGREED WITH PLAN OF CARE: Patient    Deland tedra Ip OTR/L,CLT 01/30/2024, 4:54 PM

## 2024-02-06 ENCOUNTER — Ambulatory Visit: Admitting: Occupational Therapy

## 2024-02-09 ENCOUNTER — Ambulatory Visit: Admitting: Occupational Therapy

## 2024-02-09 ENCOUNTER — Encounter: Payer: Self-pay | Admitting: Occupational Therapy

## 2024-02-09 DIAGNOSIS — M25531 Pain in right wrist: Secondary | ICD-10-CM

## 2024-02-09 DIAGNOSIS — M6281 Muscle weakness (generalized): Secondary | ICD-10-CM

## 2024-02-09 NOTE — Therapy (Addendum)
 OUTPATIENT OCCUPATIONAL THERAPY ORTHO TREATMENT  Patient Name: Hayley Avila MRN: [id number] DOB:Dec 06, 1990, 33 y.o., female Today's Date: 02/20/2024  PCP: none listed REFERRING PROVIDER: Claudene Ingle, MARLA PA-C  END OF SESSION:  OT End of Session - 02/20/24 0648     Visit Number 3    Number of Visits 13    Date for OT Re-Evaluation 03/08/24    OT Start Time 0945    OT Stop Time 1030    OT Time Calculation (min) 45 min    Activity Tolerance Patient tolerated treatment well    Behavior During Therapy [facility] for tasks assessed/performed           Past Medical History:  Diagnosis Date   Asthma    Heart murmur    S/P ACL surgery 2018   Past Surgical History:  Procedure Laterality Date   ARTHROSCOPIC REPAIR ACL Right 2018   EYE SURGERY Right 2009   Cyst removed from schlers   Patient Active Problem List   Diagnosis Date Noted   Allergies 12/19/2023   Pain in joint of right knee 12/20/2021   Contusion of right knee 03/20/2019   Knee pain 03/18/2019   Sprain of medial collateral ligament of right knee 03/18/2019   History of repair of ACL 03/18/2019    ONSET DATE: 12/27/2023  REFERRING DIAG: right wrist pain  THERAPY DIAG:  Muscle weakness (generalized)  Pain in right wrist  Rationale for Evaluation and Treatment: Rehabilitation  SUBJECTIVE:   SUBJECTIVE STATEMENT: See below in treatment note for today's subjective  Pt accompanied by: self  PERTINENT HISTORY: 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  She has been wearing a wrist brace on the right during activities at work and home but the pain has not changed or lessened over time.  PRECAUTIONS: Other: light duty, no suspect contact, limit lifting and pulling motions  RED FLAGS: None   WEIGHT BEARING  RESTRICTIONS: Yes    PAIN:  Are you having pain? 4-6/10 with wrist flexion - thumb volar wrist  FALLS: Has patient fallen in last 6 months? No  LIVING ENVIRONMENT: Lives with: lives with their spouse Lives in: House/apartment Has following equipment at home: None  PLOF: Independent  PATIENT GOALS: Pt  would like to return to previous level of full independence at home and work  NEXT MD VISIT: TBA  OBJECTIVE:  Note: Objective measures were completed at Evaluation unless otherwise noted.  HAND DOMINANCE: Right  ADLs: Overall ADLs: Pt currently wearing wrist brace for support during all self care, IADL and work tasks.  She removes for shower and also sleeps with brace.  Pt reports she has difficulty with opening containers, taking off her bra, grabbing items, tying shoes, performing hair care, pulling up pants, cooking, laundry and being able to work out with weights.  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  She has difficulty with opening doors, light pain with writing.  She denies any issues with typing at the moment, able to manage her duty weapon when wearing her brace and has changed her gun holster to increase ease of removing weapon.    FUNCTIONAL OUTCOME MEASURES: TBA  UPPER EXTREMITY ROM:  Active ROM Right eval Left eval R 01/30/24 R 02/09/24  Shoulder flexion WNL WNL    Shoulder abduction WNL WNL    Shoulder adduction      Shoulder extension      Shoulder internal rotation WNL WNL    Shoulder external rotation WNL WNL    Elbow flexion WNL WNL    Elbow extension WNL WNL    Wrist flexion 74 80 80 84  Wrist extension 40 60 55/ 60 close fist 62/60 closed fist  Wrist ulnar deviation 20 WNL 30 35  Wrist radial deviation 18 WNL 20 24  Wrist pronation 90 WNL 90   Wrist supination 90 WNL 90   (Blank rows = not tested)  Full fisting on right and able  to demonstrate oppositional movements to small finger and base of small finger.   UPPER EXTREMITY MMT:     MMT Right eval Left eval  Shoulder flexion WNL WNL  Shoulder abduction WNL WNL  Shoulder adduction    Shoulder extension    Shoulder internal rotation WNL WNL  Shoulder external rotation WNL WNL  Middle trapezius    Lower trapezius    Elbow flexion WNL WNL  Elbow extension WNL WNL  Wrist flexion 4/5 5/5  Wrist extension 4/5 5/5  Wrist ulnar deviation 4/5 5/5  Wrist radial deviation 4/5 5/5  Wrist pronation 5/5 5/5  Wrist supination 5/5 5/5  (Blank rows = not tested)  HAND FUNCTION: Grip strength: Right: 38 lbs; Left: 56 lbs, Lateral pinch: Right: 12 lbs, Left: 18 lbs, and 3 point pinch: Right: 12 lbs, Left: 19 lbs  COORDINATION: WFL  SENSATION: WFL  EDEMA: mild edema noted in dorsum of right wrist, also along MPs of the hand.  Circumferential measurements of right wrist compared to left is normal and symmetrical.  Pt reports increased edema with increased use at times and reports increased edema yesterday.    COGNITION: Overall cognitive status: Within functional limits for tasks assessed   TREATMENT DATE: 02/09/2024        Pt reports she is doing well, still has some pain at times and feels motion may be slightly better.                                                                                                            Fluidotherapy: Fluidotherapy for 8 minutes with 2 rotations of ice for 1 minute to increase motion but also decrease pain and edema and stiffness.  Pt encouraged to perform active motion of hand and wrist while in fluido.    Manual Therapy:  Following fluido, pt seen for manual therapy techniques with soft tissue massage and use of Graston tool Nr2 for sweeping of forearm to restore tissue mobility and to improve ROM, decrease pain.    Therapeutic Exercises:  Wrist flexion extension with loose fist, pain free Supination/pronation, 12  reps  radial ulnar deviation 12 reps pain-free Tendon glides 10 reps pain-free Measurements taken this date, wrist flexion 84 degrees, extension with open hand 62 degrees, closed fist 60  degrees.  Ulnar deviation 35 and radial deviation 24 degrees.   Patient to continue to we neoprene Benik wrap to wear with light activities to increase motion but still decreased pain. Patient to wear this hard brace with heavy activities or activities that causes pain. PATIENT EDUCATION: Education details: contrast for edema control, ROM exercises Person educated: Patient Education method: Explanation Education comprehension: verbalized understanding   GOALS: Goals reviewed with patient? Yes  SHORT TERM GOALS: Target date: 02/09/2024  Pt will demonstrate understanding of use of contrast for home program for edema control of right wrist.  Baseline: no knowledge at eval Goal status: INITIAL  LONG TERM GOALS: Target date: 03/08/2024  Pt will demonstrate home exercise program with modified independence Baseline: no current program Goal status: INITIAL  2.  Pt will demonstrate ability to open jars and containers independently with pain 2/10 or less. Baseline: difficulty at eval and pain noted. Goal status: INITIAL  3.  Pt will demonstrate ability to complete self care tasks with modified independence with pain 2/10 or less.   Baseline: difficulty with hair care, tying shoes, pulling up pants and taking off bra. Goal status: INITIAL  4.  Pt will demonstrate ability to open and close doors at work and home independently and pain 2/10 or less.  Baseline: difficulty with opening doors at work, increased pain when performing Goal status: INITIAL  5.    Pt will return to full duty work tasks including suspect contact independently and safely without difficulty or pain in wrist.  Baseline:  pt currently on light duty and no suspect contact. Goal status: INITIAL   ASSESSMENT:  CLINICAL  IMPRESSION: Patient is a 33 y.o. female who was seen today for occupational therapy for right wrist pain. Pt denies pain today at rest however has increased pain with movement patterns towards wrist flexion-patient decreased wrist extension but pain-free.  Patient also with some pain on the volar wrist with thumb flexion and radial abduction with resistance.  She also has pain with select daily activities such as opening containers, taking off her bra, grabbing items, tying shoes, performing hair care, pulling up pants, cooking, laundry and being able to work out with weights.  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  She has difficulty with opening doors at work, light pain with writing.  Pt has been performing contrast at home followed by loose fist active range of motion for wrist flexion extension pain-free 10-12 reps.  Tendon glides.  She is wearing Benik neoprene wrist wrap to use with light activities to allow more motion but still decreased pain.  Hard brace with heavy or painful activities.  Measurements this date shows improvement in active motion and pain decreased overall to none at rest and 3-4/ 10 with select movements. Continue with current home program to decrease pain, increase motion and strength. She presents with right wrist pain, decreased strength and ROM.  She would benefit from skilled OT services to maximize her independence and safety with tasks at work, home and in the community.    PERFORMANCE DEFICITS: in functional skills including ADLs, IADLs, edema, ROM, strength, and pain, and psychosocial skills including environmental adaptation, habits, and routines and behaviors.   IMPAIRMENTS: are limiting patient from ADLs, IADLs, work, and leisure.   COMORBIDITIES: has no other co-morbidities that affects occupational performance. Patient will benefit from skilled OT to  address above  impairments and improve overall function.  MODIFICATION OR ASSISTANCE TO COMPLETE EVALUATION: No modification of tasks or assist necessary to complete an evaluation.  OT OCCUPATIONAL PROFILE AND HISTORY: Problem focused assessment: Including review of records relating to presenting problem.  CLINICAL DECISION MAKING: Moderate - several treatment options, min-mod task modification necessary  REHAB POTENTIAL: Excellent  EVALUATION COMPLEXITY: Low   PLAN:  OT FREQUENCY: 1-2x/week  OT DURATION: 6 weeks  PLANNED INTERVENTIONS: 97168 OT Re-evaluation, 97535 self care/ADL training, 02889 therapeutic exercise, 97530 therapeutic activity, 97112 neuromuscular re-education, 97035 ultrasound, 97018 paraffin, 02960 fluidotherapy, 97010 moist heat, 97010 cryotherapy, and 97034 contrast bath    CONSULTED AND AGREED WITH PLAN OF CARE: Patient   Greig ONEIDA G[place], OTR/L, CLT  02/20/2024, 6:48 AM

## 2024-02-13 ENCOUNTER — Ambulatory Visit: Admitting: Occupational Therapy

## 2024-02-13 DIAGNOSIS — M6281 Muscle weakness (generalized): Secondary | ICD-10-CM | POA: Diagnosis not present

## 2024-02-13 DIAGNOSIS — M25531 Pain in right wrist: Secondary | ICD-10-CM

## 2024-02-14 ENCOUNTER — Other Ambulatory Visit: Admitting: Physician Assistant

## 2024-02-20 ENCOUNTER — Encounter: Payer: Self-pay | Admitting: Occupational Therapy

## 2024-02-20 ENCOUNTER — Ambulatory Visit: Admitting: Occupational Therapy

## 2024-02-20 DIAGNOSIS — M25531 Pain in right wrist: Secondary | ICD-10-CM | POA: Diagnosis not present

## 2024-02-20 DIAGNOSIS — M6281 Muscle weakness (generalized): Secondary | ICD-10-CM | POA: Diagnosis not present

## 2024-02-20 NOTE — Therapy (Signed)
 OUTPATIENT OCCUPATIONAL THERAPY ORTHO TREATMENT  Patient Name: Hayley Avila MRN: [id number] DOB:Jun 25, 1991, 33 y.o., female Today's Date: 02/20/2024  PCP: none listed REFERRING PROVIDER: Claudene Ingle, MARLA PA-C  END OF SESSION:  OT End of Session - 02/20/24 0704     Visit Number 4    Number of Visits 13    Date for OT Re-Evaluation 03/08/24    OT Start Time 1531    OT Stop Time 1620    OT Time Calculation (min) 49 min    Activity Tolerance Patient tolerated treatment well    Behavior During Therapy [facility] for tasks assessed/performed           Past Medical History:  Diagnosis Date   Asthma    Heart murmur    S/P ACL surgery 2018   Past Surgical History:  Procedure Laterality Date   ARTHROSCOPIC REPAIR ACL Right 2018   EYE SURGERY Right 2009   Cyst removed from schlers   Patient Active Problem List   Diagnosis Date Noted   Allergies 12/19/2023   Pain in joint of right knee 12/20/2021   Contusion of right knee 03/20/2019   Knee pain 03/18/2019   Sprain of medial collateral ligament of right knee 03/18/2019   History of repair of ACL 03/18/2019    ONSET DATE: 12/27/2023  REFERRING DIAG: right wrist pain  THERAPY DIAG:  Muscle weakness (generalized)  Pain in right wrist  Rationale for Evaluation and Treatment: Rehabilitation  SUBJECTIVE:   SUBJECTIVE STATEMENT: See below in treatment note for today's subjective  Pt accompanied by: self  PERTINENT HISTORY: 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  She has been wearing a wrist brace on the right during activities at work and home but the pain has not changed or lessened over time.  PRECAUTIONS: Other: light duty, no suspect contact, limit lifting and pulling motions  RED FLAGS: None   WEIGHT BEARING  RESTRICTIONS: Yes    PAIN:  Are you having pain? 4-6/10 with wrist flexion - thumb volar wrist  FALLS: Has patient fallen in last 6 months? No  LIVING ENVIRONMENT: Lives with: lives with their spouse Lives in: House/apartment Has following equipment at home: None  PLOF: Independent  PATIENT GOALS: Pt  would like to return to previous level of full independence at home and work  NEXT MD VISIT: TBA  OBJECTIVE:  Note: Objective measures were completed at Evaluation unless otherwise noted.  HAND DOMINANCE: Right  ADLs: Overall ADLs: Pt currently wearing wrist brace for support during all self care, IADL and work tasks.  She removes for shower and also sleeps with brace.  Pt reports she has difficulty with opening containers, taking off her bra, grabbing items, tying shoes, performing hair care, pulling up pants, cooking, laundry and being able to work out with weights.  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  She has difficulty with opening doors, light pain with writing.  She denies any issues with typing at the moment, able to manage her duty weapon when wearing her brace and has changed her gun holster to increase ease of removing weapon.    FUNCTIONAL OUTCOME MEASURES: TBA  UPPER EXTREMITY ROM:  Active ROM Right eval Left eval R 01/30/24 R 02/09/24  Shoulder flexion WNL WNL    Shoulder abduction WNL WNL    Shoulder adduction      Shoulder extension      Shoulder internal rotation WNL WNL    Shoulder external rotation WNL WNL    Elbow flexion WNL WNL    Elbow extension WNL WNL    Wrist flexion 74 80 80 84  Wrist extension 40 60 55/ 60 close fist 62/60 closed fist  Wrist ulnar deviation 20 WNL 30 35  Wrist radial deviation 18 WNL 20 24  Wrist pronation 90 WNL 90   Wrist supination 90 WNL 90   (Blank rows = not tested)  Full fisting on right and able  to demonstrate oppositional movements to small finger and base of small finger.   UPPER EXTREMITY MMT:     MMT Right eval Left eval  Shoulder flexion WNL WNL  Shoulder abduction WNL WNL  Shoulder adduction    Shoulder extension    Shoulder internal rotation WNL WNL  Shoulder external rotation WNL WNL  Middle trapezius    Lower trapezius    Elbow flexion WNL WNL  Elbow extension WNL WNL  Wrist flexion 4/5 5/5  Wrist extension 4/5 5/5  Wrist ulnar deviation 4/5 5/5  Wrist radial deviation 4/5 5/5  Wrist pronation 5/5 5/5  Wrist supination 5/5 5/5  (Blank rows = not tested)  HAND FUNCTION: Grip strength: Right: 38 lbs; Left: 56 lbs, Lateral pinch: Right: 12 lbs, Left: 18 lbs, and 3 point pinch: Right: 12 lbs, Left: 19 lbs  COORDINATION: WFL  SENSATION: WFL  EDEMA: mild edema noted in dorsum of right wrist, also along MPs of the hand.  Circumferential measurements of right wrist compared to left is normal and symmetrical.  Pt reports increased edema with increased use at times and reports increased edema yesterday.    COGNITION: Overall cognitive status: Within functional limits for tasks assessed   TREATMENT DATE: 02/09/2024        Pt reports she has been working today and in the woods for several hours.  Pain 4/10 right wrist. She has been doing contrast at home and added a round of contrast while at work in the am to help wrist and hand get started for the pain, followed by exercises. She has been wearing the benik brace for support and the hard brace for heavier tasks.                                                                                                           Fluidotherapy: Fluidotherapy for 8 minutes with 2 rotations of ice for 1 minute to increase motion but also decrease pain and edema and stiffness.  Pt encouraged to perform active motion of hand and wrist while in fluido.    Manual Therapy:  Following fluido, pt seen for manual therapy techniques  with soft tissue massage and use of Graston tool Nr2 for sweeping of forearm to restore tissue mobility and to improve ROM, decrease  pain.    Therapeutic Exercises:  Wrist flexion extension with loose fist, pain free Supination/pronation, 12 reps x 2 sets radial ulnar deviation 12 reps x 2 pain-free Tendon glides 10 reps pain-free Measurements taken this date, wrist flexion 82 degrees, extension with open hand 60 degrees, closed fist 60 degrees.  Ulnar deviation 34 and radial deviation 22 degrees.  Grip strength right 55#, left 72#  Patient to continue to we neoprene Benik wrap to wear with light activities to increase motion but still decreased pain. Patient to wear this hard brace with heavy activities or activities that causes pain. PATIENT EDUCATION: Education details: contrast for edema control, ROM exercises Person educated: Patient Education method: Explanation Education comprehension: verbalized understanding   GOALS: Goals reviewed with patient? Yes  SHORT TERM GOALS: Target date: 02/09/2024  Pt will demonstrate understanding of use of contrast for home program for edema control of right wrist.  Baseline: no knowledge at eval Goal status: INITIAL  LONG TERM GOALS: Target date: 03/08/2024  Pt will demonstrate home exercise program with modified independence Baseline: no current program Goal status: INITIAL  2.  Pt will demonstrate ability to open jars and containers independently with pain 2/10 or less. Baseline: difficulty at eval and pain noted. Goal status: INITIAL  3.  Pt will demonstrate ability to complete self care tasks with modified independence with pain 2/10 or less.   Baseline: difficulty with hair care, tying shoes, pulling up pants and taking off bra. Goal status: INITIAL  4.  Pt will demonstrate ability to open and close doors at work and home independently and pain 2/10 or less.  Baseline: difficulty with opening doors at work, increased pain when  performing Goal status: INITIAL  5.    Pt will return to full duty work tasks including suspect contact independently and safely without difficulty or pain in wrist.  Baseline:  pt currently on light duty and no suspect contact. Goal status: INITIAL   ASSESSMENT:  CLINICAL IMPRESSION: Patient is a 33 y.o. female who was seen today for occupational therapy for right wrist pain. Pt denies pain today at rest however has increased pain with movement patterns towards wrist flexion-patient decreased wrist extension but pain-free.  Patient also with some pain on the volar wrist with thumb flexion and radial abduction with resistance.  She also has pain with select daily activities such as opening containers, taking off her bra, grabbing items, tying shoes, performing hair care, pulling up pants, cooking, laundry and being able to work out with weights.  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  She has difficulty with opening doors at work, light pain with writing.  Pt has been performing contrast at home followed by loose fist active range of motion for wrist flexion extension pain-free 10-12 reps.  Tendon glides.  She is wearing Benik neoprene wrist wrap to use with light activities to allow more motion but still decreased pain.  Hard brace with heavy or painful activities. Pain remains around 4/10 with use and with wrist flexion towards end ranges.  Grip strength remeasured this date with significant improvement on right to 55#, it is still decreased when compared to her non dominant hand but improved since last measured.  Motion has improved over time and been consistent the last 2 sessions.  Continue with current home program to decrease pain, increase motion and strength. She presents with  right wrist pain, decreased strength and ROM.  She would benefit from skilled OT services to maximize  her independence and safety with tasks at work, home and in the community.    PERFORMANCE DEFICITS: in functional skills including ADLs, IADLs, edema, ROM, strength, and pain, and psychosocial skills including environmental adaptation, habits, and routines and behaviors.   IMPAIRMENTS: are limiting patient from ADLs, IADLs, work, and leisure.   COMORBIDITIES: has no other co-morbidities that affects occupational performance. Patient will benefit from skilled OT to address above impairments and improve overall function.  MODIFICATION OR ASSISTANCE TO COMPLETE EVALUATION: No modification of tasks or assist necessary to complete an evaluation.  OT OCCUPATIONAL PROFILE AND HISTORY: Problem focused assessment: Including review of records relating to presenting problem.  CLINICAL DECISION MAKING: Moderate - several treatment options, min-mod task modification necessary  REHAB POTENTIAL: Excellent  EVALUATION COMPLEXITY: Low  PLAN:  OT FREQUENCY: 1-2x/week  OT DURATION: 6 weeks  PLANNED INTERVENTIONS: 97168 OT Re-evaluation, 97535 self care/ADL training, 02889 therapeutic exercise, 97530 therapeutic activity, 97112 neuromuscular re-education, 97035 ultrasound, 97018 paraffin, 02960 fluidotherapy, 97010 moist heat, 97010 cryotherapy, and 97034 contrast bath  CONSULTED AND AGREED WITH PLAN OF CARE: Patient   Greig ONEIDA Ghent, OTR/L, CLT  02/20/2024, 7:15 AM

## 2024-02-20 NOTE — Therapy (Signed)
 OUTPATIENT OCCUPATIONAL THERAPY ORTHO TREATMENT  Patient Name: Hayley Avila MRN: [id number] DOB:May 23, 1991, 33 y.o., female Today's Date: 02/20/2024  PCP: none listed REFERRING PROVIDER: Claudene Ingle, MARLA PA-C  END OF SESSION:  OT End of Session - 02/20/24 0829     Visit Number 5    Number of Visits 13    Date for OT Re-Evaluation 03/08/24    OT Start Time 0819    OT Stop Time 0903    OT Time Calculation (min) 44 min    Activity Tolerance Patient tolerated treatment well    Behavior During Therapy [facility] for tasks assessed/performed           Past Medical History:  Diagnosis Date   Asthma    Heart murmur    S/P ACL surgery 2018   Past Surgical History:  Procedure Laterality Date   ARTHROSCOPIC REPAIR ACL Right 2018   EYE SURGERY Right 2009   Cyst removed from schlers   Patient Active Problem List   Diagnosis Date Noted   Allergies 12/19/2023   Pain in joint of right knee 12/20/2021   Contusion of right knee 03/20/2019   Knee pain 03/18/2019   Sprain of medial collateral ligament of right knee 03/18/2019   History of repair of ACL 03/18/2019    ONSET DATE: 12/27/2023  REFERRING DIAG: right wrist pain  THERAPY DIAG:  Muscle weakness (generalized)  Pain in right wrist  Rationale for Evaluation and Treatment: Rehabilitation  SUBJECTIVE:   SUBJECTIVE STATEMENT: I may be need to if I return to full duty do fitness type of test.  I can tell him getting better.  But I still feel some wrist pain especially with pinching -like pulling my zip or writing.  At the most 4/10.  I am mostly wearing now the soft splint.  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  She has been wearing a wrist brace on the right during activities  at work and home but the pain has not changed or lessened over time.  PRECAUTIONS: Other: light duty, no suspect contact, limit lifting and pulling motions  RED FLAGS: None   WEIGHT BEARING RESTRICTIONS: Yes    PAIN:  Are you having pain? 4/10 volar wrist at the worst with a pinching  FALLS: Has patient fallen in last 6 months? No  LIVING ENVIRONMENT: Lives with: lives with their spouse Lives in: House/apartment Has following equipment at home: None  PLOF: Independent  PATIENT GOALS: Pt  would like to return to previous level of full independence at home and work  NEXT MD VISIT: TBA  OBJECTIVE:  Note: Objective measures were completed at Evaluation unless otherwise noted.  HAND DOMINANCE: Right  ADLs: Overall ADLs: Pt currently wearing wrist brace for support during all self care, IADL and work tasks.  She removes for shower and also sleeps with brace.  Pt reports she has difficulty with opening containers, taking off her bra, grabbing items, tying shoes, performing hair care, pulling up pants, cooking, laundry and being able to work out with weights.  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  She has difficulty with opening doors, light pain  with writing.  She denies any issues with typing at the moment, able to manage her duty weapon when wearing her brace and has changed her gun holster to increase ease of removing weapon.    FUNCTIONAL OUTCOME MEASURES: TBA  UPPER EXTREMITY ROM:     Active ROM Right eval Left eval R 01/30/24 R 02/09/24 R 02/20/24  Shoulder flexion WNL WNL     Shoulder abduction WNL WNL     Shoulder adduction       Shoulder extension       Shoulder internal rotation WNL WNL     Shoulder external rotation WNL WNL     Elbow flexion WNL WNL     Elbow extension WNL WNL     Wrist flexion 74 80 80 84 100  Wrist extension 40 60 55/ 60 close  fist 62/60 closed fist 70  Wrist ulnar deviation 20 WNL 30 35 35  Wrist radial deviation 18 WNL 20 24 25   Wrist pronation 90 WNL 90  90  Wrist supination 90 WNL 90  90  (Blank rows = not tested)  Full fisting on right and able to demonstrate oppositional movements to small finger and base of small finger.   UPPER EXTREMITY MMT:     MMT Right eval Left eval  Shoulder flexion WNL WNL  Shoulder abduction WNL WNL  Shoulder adduction    Shoulder extension    Shoulder internal rotation WNL WNL  Shoulder external rotation WNL WNL  Middle trapezius    Lower trapezius    Elbow flexion WNL WNL  Elbow extension WNL WNL  Wrist flexion 4/5 5/5  Wrist extension 4/5 5/5  Wrist ulnar deviation 4/5 5/5  Wrist radial deviation 4/5 5/5  Wrist pronation 5/5 5/5  Wrist supination 5/5 5/5  (Blank rows = not tested)  HAND FUNCTION: Grip strength: Right: 38 lbs; Left: 56 lbs, Lateral pinch: Right: 12 lbs, Left: 18 lbs, and 3 point pinch: Right: 12 lbs, Left: 19 lbs 02/20/24  Grip strength: Right: 55 lbs; Left: 75 lbs, Lateral pinch: Right: 14 lbs, Left: 17 lbs, and 3 point pinch: Right: 13 lbs, Left: 21 lbs  COORDINATION: WFL  SENSATION: WFL  EDEMA: mild edema noted in dorsum of right wrist, also along MPs of the hand.  Circumferential measurements of right wrist compared to left is normal and symmetrical.  Pt reports increased edema with increased use at times and reports increased edema yesterday.    COGNITION: Overall cognitive status: Within functional limits for tasks assessed   TREATMENT DATE: 02/20/2024        Patient is active range of motion improved from last time seen.  In all planes.  Less pain. Pain mostly with endrange wrist flexion with resistance as well as slight pull and decreased wrist extension. Radial ulnar deviation pain-free. Supination pronation pain with resistance endrange. Opposition within normal limits Fisting within normal limits Thumb palmar radial  abduction within normal limits pain-free Grip and prehension assessed.  Pain with 3-point pinch at volar wrist.  See flowsheet  Fluidotherapy: Fluidotherapy for 8 minutes with 1 rotation of ice for 1 minute to increase motion but also decrease pain and edema and stiffness.  Pt encouraged to perform active motion of hand and wrist while in fluido.    Manual Therapy:  Following fluido, pt seen for manual therapy techniques with soft tissue massage and use of Graston tool Nr2 for sweeping of forearm to restore tissue mobility and to improve ROM, decrease pain.  As well as gentle traction to wrist followed by joint mobs as well as metacarpal and carpal spreads and wrist flexion extension.  Therapeutic Exercises:  Patient to continue with contrast followed by active range of motion for wrist flexion extension as well as radial ulnar deviation supination pronation. Add gentle prayer stretch for composite wrist extension this date 10 reps pain-free Add isometric strengthening at neutral position for radial ulnar deviation 12 reps As well as isometric strengthening for wrist flexion extension at neutral 12 reps. Twice a day patient can increase to a second set in 2 to 3 days if pain-free in the third set over the weekend if pain-free.  Patient fitted with Isotoner glove to use at nighttime to decrease lingering edema in right hand .  Educated to keep pain under 2/10. If increased above that needs to wear wrist brace. Patient to continue to we neoprene Benik wrap to wear with light activities to increase motion but still decreased pain.  PATIENT EDUCATION: Education details: contrast for edema control, ROM exercises Person educated: Patient Education method: Explanation Education comprehension: verbalized understanding   GOALS: Goals reviewed with patient? Yes  SHORT TERM GOALS: Target date:  02/09/2024  Pt will demonstrate understanding of use of contrast for home program for edema control of right wrist.  Baseline: no knowledge at eval Goal status: INITIAL  LONG TERM GOALS: Target date: 03/08/2024  Pt will demonstrate home exercise program with modified independence Baseline: no current program Goal status: INITIAL  2.  Pt will demonstrate ability to open jars and containers independently with pain 2/10 or less. Baseline: difficulty at eval and pain noted. Goal status: INITIAL  3.  Pt will demonstrate ability to complete self care tasks with modified independence with pain 2/10 or less.   Baseline: difficulty with hair care, tying shoes, pulling up pants and taking off bra. Goal status: INITIAL  4.  Pt will demonstrate ability to open and close doors at work and home independently and pain 2/10 or less.  Baseline: difficulty with opening doors at work, increased pain when performing Goal status: INITIAL  5.    Pt will return to full duty work tasks including suspect contact independently and safely without difficulty or pain in wrist.  Baseline:  pt currently on light duty and no suspect contact. Goal status: INITIAL   ASSESSMENT:  CLINICAL IMPRESSION: Patient is a 33 y.o. female who was seen today for occupational therapy for right wrist pain. Pt denies pain today at rest however has increased pain with movement patterns towards wrist flexion-patient decreased wrist extension but pain-free.  Patient also with some pain on the volar wrist with 3-point pinch.  She also has pain with select daily activities such as opening containers, taking off her bra, grabbing items, tying shoes, performing hair care, pulling up pants, cooking, laundry and being able to work out with weights.  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  She  has difficulty with opening doors  at work, light pain with writing.  Pt has been performing contrast at home followed by loose fist active range of motion for wrist flexion extension pain-free 10-12 reps.  Tendon glides.  She is wearing Benik neoprene wrist wrap with all activities-did not wear her brace since last time seen.  Pain at the most 4/10.  Patient showed great progress in range of motion as well as grip and prevention.  Was able to add passive range of motion stretches as well as isometric strengthening at neutral exercises.  Is still decreased when compared to her non dominant hand but improved since last measured.  Continue with current home program to decrease pain, increase motion and strength. She presents with right wrist pain, decreased strength and ROM.  She would benefit from skilled OT services to maximize her independence and safety with tasks at work, home and in the community.    PERFORMANCE DEFICITS: in functional skills including ADLs, IADLs, edema, ROM, strength, and pain, and psychosocial skills including environmental adaptation, habits, and routines and behaviors.   IMPAIRMENTS: are limiting patient from ADLs, IADLs, work, and leisure.   COMORBIDITIES: has no other co-morbidities that affects occupational performance. Patient will benefit from skilled OT to address above impairments and improve overall function.  MODIFICATION OR ASSISTANCE TO COMPLETE EVALUATION: No modification of tasks or assist necessary to complete an evaluation.  OT OCCUPATIONAL PROFILE AND HISTORY: Problem focused assessment: Including review of records relating to presenting problem.  CLINICAL DECISION MAKING: Moderate - several treatment options, min-mod task modification necessary  REHAB POTENTIAL: Excellent  EVALUATION COMPLEXITY: Low  PLAN:  OT FREQUENCY: 1-2x/week  OT DURATION: 6 weeks  PLANNED INTERVENTIONS: 97168 OT Re-evaluation, 97535 self care/ADL training, 02889 therapeutic exercise, 97530 therapeutic activity,  97112 neuromuscular re-education, 97035 ultrasound, 97018 paraffin, 02960 fluidotherapy, 97010 moist heat, 97010 cryotherapy, and 97034 contrast bath  CONSULTED AND AGREED WITH PLAN OF CARE: Patient   Greig ONEIDA Ghent, OTR/L, CLT  02/20/2024, 4:06 PM

## 2024-02-27 ENCOUNTER — Ambulatory Visit: Admitting: Occupational Therapy

## 2024-03-04 ENCOUNTER — Ambulatory Visit: Attending: Physician Assistant | Admitting: Occupational Therapy

## 2024-03-04 DIAGNOSIS — M6281 Muscle weakness (generalized): Secondary | ICD-10-CM | POA: Insufficient documentation

## 2024-03-04 DIAGNOSIS — M25531 Pain in right wrist: Secondary | ICD-10-CM | POA: Insufficient documentation

## 2024-03-04 NOTE — Therapy (Signed)
 OUTPATIENT OCCUPATIONAL THERAPY ORTHO TREATMENT  Patient Name: Hayley Avila MRN: [id number] DOB:1990-10-06, 33 y.o., female Today's Date: 03/04/2024  PCP: none listed REFERRING PROVIDER: Claudene Ingle, MARLA PA-C  END OF SESSION:  OT End of Session - 03/04/24 1517     Visit Number 6    Number of Visits 13    Date for OT Re-Evaluation 03/08/24    OT Start Time 1517    OT Stop Time 1558    OT Time Calculation (min) 41 min    Activity Tolerance Patient tolerated treatment well    Behavior During Therapy [facility] for tasks assessed/performed           Past Medical History:  Diagnosis Date   Asthma    Heart murmur    S/P ACL surgery 2018   Past Surgical History:  Procedure Laterality Date   ARTHROSCOPIC REPAIR ACL Right 2018   EYE SURGERY Right 2009   Cyst removed from schlers   Patient Active Problem List   Diagnosis Date Noted   Allergies 12/19/2023   Pain in joint of right knee 12/20/2021   Contusion of right knee 03/20/2019   Knee pain 03/18/2019   Sprain of medial collateral ligament of right knee 03/18/2019   History of repair of ACL 03/18/2019    ONSET DATE: 12/27/2023  REFERRING DIAG: right wrist pain  THERAPY DIAG:  Muscle weakness (generalized)  Pain in right wrist  Rationale for Evaluation and Treatment: Rehabilitation  SUBJECTIVE:   SUBJECTIVE STATEMENT: I did okay he did not really had any pain.  Except when pulling my pillow on the bed or behind my back.  But then I threw a ball yesterday and my wrist hurt on the bottom part..  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  She has been wearing a wrist brace on the right during activities at work and home but the pain has not changed or lessened over  time.  PRECAUTIONS: Other: light duty, no suspect contact, limit lifting and pulling motions  RED FLAGS: None   WEIGHT BEARING RESTRICTIONS: Yes    PAIN:  Are you having pain? 1/10 volar wrist with flexion  FALLS: Has patient fallen in last 6 months? No  LIVING ENVIRONMENT: Lives with: lives with their spouse Lives in: House/apartment Has following equipment at home: None  PLOF: Independent  PATIENT GOALS: Pt  would like to return to previous level of full independence at home and work  NEXT MD VISIT: TBA  OBJECTIVE:  Note: Objective measures were completed at Evaluation unless otherwise noted.  HAND DOMINANCE: Right  ADLs: Overall ADLs: Pt currently wearing wrist brace for support during all self care, IADL and work tasks.  She removes for shower and also sleeps with brace.  Pt reports she has difficulty with opening containers, taking off her bra, grabbing items, tying shoes, performing hair care, pulling up pants, cooking, laundry and being able to work out with weights.  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  She has difficulty with opening doors, light pain with writing.  She denies any issues with typing at the moment, able to manage her duty weapon when  wearing her brace and has changed her gun holster to increase ease of removing weapon.    FUNCTIONAL OUTCOME MEASURES: TBA  UPPER EXTREMITY ROM:     Active ROM Right eval Left eval R 01/30/24 R 02/09/24 R 02/20/24 R 03/04/24  Shoulder flexion WNL WNL      Shoulder abduction WNL WNL      Shoulder adduction        Shoulder extension        Shoulder internal rotation WNL WNL      Shoulder external rotation WNL WNL      Elbow flexion WNL WNL      Elbow extension WNL WNL      Wrist flexion 74 80 80 84 100 100 pain 1/10 volar wrist   Wrist extension 40 60 55/ 60 close fist 62/60 closed fist 70 70   Wrist ulnar deviation 20 WNL 30 35 35 35  Wrist radial deviation 18 WNL 20 24 25 25   Wrist pronation 90 WNL 90  90 90  Wrist supination 90 WNL 90  90 90  (Blank rows = not tested)  Full fisting on right and able to demonstrate oppositional movements to small finger and base of small finger.   UPPER EXTREMITY MMT:     MMT Right eval Left eval  Shoulder flexion WNL WNL  Shoulder abduction WNL WNL  Shoulder adduction    Shoulder extension    Shoulder internal rotation WNL WNL  Shoulder external rotation WNL WNL  Middle trapezius    Lower trapezius    Elbow flexion WNL WNL  Elbow extension WNL WNL  Wrist flexion 4/5 5/5  Wrist extension 4/5 5/5  Wrist ulnar deviation 4/5 5/5  Wrist radial deviation 4/5 5/5  Wrist pronation 5/5 5/5  Wrist supination 5/5 5/5  (Blank rows = not tested)  HAND FUNCTION: Grip strength: Right: 38 lbs; Left: 56 lbs, Lateral pinch: Right: 12 lbs, Left: 18 lbs, and 3 point pinch: Right: 12 lbs, Left: 19 lbs 02/20/24  Grip strength: Right: 55 lbs; Left: 75 lbs, Lateral pinch: Right: 14 lbs, Left: 17 lbs, and 3 point pinch: Right: 13 lbs, Left: 21 lbs 03/04/24  Grip strength: Right: 64 lbs; Left: 75 lbs, Lateral pinch: Right: 20 lbs, Left: 17 lbs, and 3 point pinch: Right: 20 lbs, Left: 21 lbs no pain   COORDINATION: WFL  SENSATION: WFL  EDEMA: mild edema noted in dorsum of right wrist, also along MPs of the hand.  Circumferential measurements of right wrist compared to left is normal and symmetrical.  Pt reports increased edema with increased use at times and reports increased edema yesterday.    COGNITION: Overall cognitive status: Within functional limits for tasks assessed   TREATMENT DATE: 03/04/2024        Patient arrived with great progress in active range of motion within normal limits.  But since yesterday after throwing a ball some discomfort on the volar wrist with wrist flexion. Strength in supination pronation as well as radial ulnar  deviation improved greatly 5 -/5. Wrist extension 5/5 pain-free Wrist flexion pain 1/10 on the volar wrist with resistance Grip and pinch strength reassessed today great progress see flowsheets pain-free  Fluidotherapy: Fluidotherapy for 8 minutes with 2 rotation of ice for 1 minute to increase motion but also decrease pain and edema and stiffness.  Pt encouraged to perform active motion of hand and wrist while in fluido.    Manual Therapy:  Following fluido, pt seen for manual therapy techniques with soft tissue massage and use of Graston tool Nr2 for sweeping of forearm to restore tissue mobility and to improve ROM, decrease pain.  As well as gentle traction to wrist followed by joint mobs as well as metacarpal and carpal spreads and wrist flexion extension.  Therapeutic Exercises:  Patient to continue with contrast followed by active range of motion for wrist flexion extension as well as radial ulnar deviation supination pronation. Add gentle prayer stretch for composite wrist extension this date 10 reps pain-free Upgrade patient to 1 pound weight for wrist supination pronation as well as radial ulnar deviation 2 sets of 12 pain-free 2 times a day Isometric strengthening for wrist extension flexion endrange 2 sets of 12 pain-free Patient can upgrade to 3 sets 2 times a day in 3 days if pain-free Over the weekend she can increase to 2 sets of 12 with 2 pound weight if pain-free  Patient to continue to wear Isotoner glove to use at nighttime to decrease lingering edema in right hand .  Educated to keep pain under 2/10. If increased above that needs to wear wrist brace. Patient to continue to we neoprene Benik wrap to wear with light activities to increase motion but still decreased pain.  PATIENT EDUCATION: Education details: contrast for edema control, ROM exercises Person educated:  Patient Education method: Explanation Education comprehension: verbalized understanding   GOALS: Goals reviewed with patient? Yes  SHORT TERM GOALS: Target date: 02/09/2024  Pt will demonstrate understanding of use of contrast for home program for edema control of right wrist.  Baseline: no knowledge at eval Goal status: INITIAL  LONG TERM GOALS: Target date: 03/08/2024  Pt will demonstrate home exercise program with modified independence Baseline: no current program Goal status: INITIAL  2.  Pt will demonstrate ability to open jars and containers independently with pain 2/10 or less. Baseline: difficulty at eval and pain noted. Goal status: INITIAL  3.  Pt will demonstrate ability to complete self care tasks with modified independence with pain 2/10 or less.   Baseline: difficulty with hair care, tying shoes, pulling up pants and taking off bra. Goal status: INITIAL  4.  Pt will demonstrate ability to open and close doors at work and home independently and pain 2/10 or less.  Baseline: difficulty with opening doors at work, increased pain when performing Goal status: INITIAL  5.    Pt will return to full duty work tasks including suspect contact independently and safely without difficulty or pain in wrist.  Baseline:  pt currently on light duty and no suspect contact. Goal status: INITIAL   ASSESSMENT:  CLINICAL IMPRESSION: Patient is a 33 y.o. female who was seen today for occupational therapy for right wrist pain.  Patient has been doing good with her range of motion  and pain free for a few days.  But after throwing a ball yesterday had increased volar wrist pain.  Patient strength in wrist is improving and supination pronation as well as radial ulnar deviation.  Grip and prehension strength improved greatly again this week.  Was able to get patient pain-free range of motion in the session.  Patient to continue with contrast and range of motion.  Upgrade patient's home  exercises to 1 pound weight in all planes except isometric strengthening for endrange wrist flexion extension.  Patient can increase to a third set later this week.  And if pain-free can upgrade to 2 pounds over the weekend.  Patient continues to have some discomfort pulling pillow behind her head or behind her back clothing.  When it is a combination with wrist flexion with a pinch of her wrist flexion with the grip.  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  She has difficulty with opening doors at work, light pain with writing.  Goal is to initiate some light weightbearing and upgrade strengthening exercises next session.  Continue with current home program to decrease pain, increase motion and strength. She presents with right wrist pain, decreased strength and ROM.  She would benefit from skilled OT services to maximize her independence and safety with tasks at work, home and in the community.    PERFORMANCE DEFICITS: in functional skills including ADLs, IADLs, edema, ROM, strength, and pain, and psychosocial skills including environmental adaptation, habits, and routines and behaviors.   IMPAIRMENTS: are limiting patient from ADLs, IADLs, work, and leisure.   COMORBIDITIES: has no other co-morbidities that affects occupational performance. Patient will benefit from skilled OT to address above impairments and improve overall function.  MODIFICATION OR ASSISTANCE TO COMPLETE EVALUATION: No modification of tasks or assist necessary to complete an evaluation.  OT OCCUPATIONAL PROFILE AND HISTORY: Problem focused assessment: Including review of records relating to presenting problem.  CLINICAL DECISION MAKING: Moderate - several treatment options, min-mod task modification necessary  REHAB POTENTIAL: Excellent  EVALUATION COMPLEXITY: Low  PLAN:  OT FREQUENCY: 1-2x/week  OT  DURATION: 6 weeks  PLANNED INTERVENTIONS: 97168 OT Re-evaluation, 97535 self care/ADL training, 02889 therapeutic exercise, 97530 therapeutic activity, 97112 neuromuscular re-education, 97035 ultrasound, 97018 paraffin, 02960 fluidotherapy, 97010 moist heat, 97010 cryotherapy, and 97034 contrast bath  CONSULTED AND AGREED WITH PLAN OF CARE: Patient   Greig ONEIDA G[place], OTR/L, CLT  03/04/2024, 4:54 PM

## 2024-03-07 ENCOUNTER — Ambulatory Visit: Payer: Self-pay | Admitting: Physician Assistant

## 2024-03-07 ENCOUNTER — Other Ambulatory Visit: Payer: Self-pay

## 2024-03-07 ENCOUNTER — Other Ambulatory Visit: Payer: Self-pay | Admitting: Physician Assistant

## 2024-03-07 ENCOUNTER — Encounter: Payer: Self-pay | Admitting: Physician Assistant

## 2024-03-07 VITALS — BP 116/84 | HR 80 | Temp 98.2°F | Resp 16

## 2024-03-07 DIAGNOSIS — F411 Generalized anxiety disorder: Secondary | ICD-10-CM

## 2024-03-07 DIAGNOSIS — S63501D Unspecified sprain of right wrist, subsequent encounter: Secondary | ICD-10-CM

## 2024-03-07 DIAGNOSIS — G4700 Insomnia, unspecified: Secondary | ICD-10-CM

## 2024-03-07 MED ORDER — CLONAZEPAM 0.5 MG PO TABS: 0.5000 mg | ORAL_TABLET | Freq: Every day | ORAL | 0 refills | Status: DC

## 2024-03-07 MED ORDER — CLONAZEPAM 0.5 MG PO TABS: 0.5000 mg | ORAL_TABLET | Freq: Every day | ORAL | 0 refills | Status: AC

## 2024-03-07 NOTE — Progress Notes (Signed)
   Subjective: Right wrist pain    Patient ID: Brandt Robert, female    DOB: 12-21-1990, 33 y.o.   MRN: [id number]  HPI Follow-up left wrist pain which occurred in May 2025 secondary to a training exercise.  Patient has continued occupational therapy with improvement.  Patient continues modified duties and is here today for work evaluation.   Review of Systems Negative except for above complaint    Objective:   Physical Exam BP 116/84  Pulse Rate 80  Temp 98.2 F (36.8 C)  Resp 16  SpO2 94 %  Patient is right-hand dominant.  Status post removal of wrist splint revealed no obvious deformity.  Mild edema to the dorsal aspect of the right wrist.  No ecchymosis or edema.  Patient has  decreased range of motion with full extension and flexion.      Assessment & Plan: Sprain right wrist  Advised to continue occupational therapy.  Patient have sessions weekly.  Patient will follow-up in 1 month.  Sooner if condition worsens.

## 2024-03-07 NOTE — Progress Notes (Signed)
 Right wrist Comp injury currently receiving OT and stated effective and thinks more sessions would be helpful.  Reports 0 pain at rest and with movement of right wrist about level 4.  Observed wearing soft right wrist splint reported helpful, but stated still some mild edema in right hand/wrist.  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

## 2024-03-11 ENCOUNTER — Ambulatory Visit: Admitting: Occupational Therapy

## 2024-03-11 DIAGNOSIS — M25531 Pain in right wrist: Secondary | ICD-10-CM | POA: Diagnosis not present

## 2024-03-11 DIAGNOSIS — M6281 Muscle weakness (generalized): Secondary | ICD-10-CM | POA: Diagnosis not present

## 2024-03-11 NOTE — Therapy (Signed)
 OUTPATIENT OCCUPATIONAL THERAPY ORTHO TREATMENT/RECERT   Patient Name: Hayley Avila MRN: [id number] DOB:21-May-1991, 33 y.o., female Today's Date: 03/11/2024  PCP: none listed REFERRING PROVIDER: Claudene Ingle, MARLA PA-C  END OF SESSION:  OT End of Session - 03/11/24 0929     Visit Number 7    Number of Visits 13    Date for OT Re-Evaluation 04/19/24    OT Start Time 0818    OT Stop Time 0900    OT Time Calculation (min) 42 min    Activity Tolerance Patient tolerated treatment well    Behavior During Therapy [facility] for tasks assessed/performed           Past Medical History:  Diagnosis Date   Asthma    Heart murmur    S/P ACL surgery 2018   Past Surgical History:  Procedure Laterality Date   ARTHROSCOPIC REPAIR ACL Right 2018   EYE SURGERY Right 2009   Cyst removed from schlers   Patient Active Problem List   Diagnosis Date Noted   Insomnia 03/07/2024   Allergies 12/19/2023   Pain in joint of right knee 12/20/2021   Contusion of right knee 03/20/2019   Knee pain 03/18/2019   Sprain of medial collateral ligament of right knee 03/18/2019   History of repair of ACL 03/18/2019    ONSET DATE: 12/27/2023  REFERRING DIAG: right wrist pain  THERAPY DIAG:  Muscle weakness (generalized)  Pain in right wrist  Rationale for Evaluation and Treatment: Rehabilitation  SUBJECTIVE:   SUBJECTIVE STATEMENT: I am still doing much better.  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  She has been wearing a wrist brace on the right during activities at work and home but the pain has not changed or lessened over time.  PRECAUTIONS: Other: light duty, no  suspect contact, limit lifting and pulling motions  RED FLAGS: None   WEIGHT BEARING RESTRICTIONS: Yes    PAIN:  Are you having pain?  1/10 volar wrist with 3-point pinch  FALLS: Has patient fallen in last 6 months? No  LIVING ENVIRONMENT: Lives with: lives with their spouse Lives in: House/apartment Has following equipment at home: None  PLOF: Independent  PATIENT GOALS: Pt  would like to return to previous level of full independence at home and work  NEXT MD VISIT: TBA  OBJECTIVE:  Note: Objective measures were completed at Evaluation unless otherwise noted.  HAND DOMINANCE: Right  ADLs: Overall ADLs: Pt currently wearing wrist brace for support during all self care, IADL and work tasks.  She removes for shower and also sleeps with brace.  Pt reports she has difficulty with opening containers, taking off her bra, grabbing items, tying shoes, performing hair care, pulling up pants, cooking, laundry and being able to work out with weights.  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  She has difficulty with opening doors, light pain with writing.  She denies any issues with typing at the moment, able to manage her duty weapon when wearing her brace and has changed her gun  holster to increase ease of removing weapon.    FUNCTIONAL OUTCOME MEASURES: TBA  UPPER EXTREMITY ROM:     Active ROM Right eval Left eval R 01/30/24 R 02/09/24 R 02/20/24 R 03/04/24  Shoulder flexion WNL WNL      Shoulder abduction WNL WNL      Shoulder adduction        Shoulder extension        Shoulder internal rotation WNL WNL      Shoulder external rotation WNL WNL      Elbow flexion WNL WNL      Elbow extension WNL WNL      Wrist flexion 74 80 80 84 100 100 pain 1/10 volar wrist   Wrist extension 40 60 55/ 60 close fist 62/60 closed fist 70 70  Wrist ulnar deviation 20 WNL 30 35 35 35   Wrist radial deviation 18 WNL 20 24 25 25   Wrist pronation 90 WNL 90  90 90  Wrist supination 90 WNL 90  90 90  (Blank rows = not tested)  Full fisting on right and able to demonstrate oppositional movements to small finger and base of small finger.   UPPER EXTREMITY MMT:     MMT Right eval Left eval  Shoulder flexion WNL WNL  Shoulder abduction WNL WNL  Shoulder adduction    Shoulder extension    Shoulder internal rotation WNL WNL  Shoulder external rotation WNL WNL  Middle trapezius    Lower trapezius    Elbow flexion WNL WNL  Elbow extension WNL WNL  Wrist flexion 4/5 5/5  Wrist extension 4/5 5/5  Wrist ulnar deviation 4/5 5/5  Wrist radial deviation 4/5 5/5  Wrist pronation 5/5 5/5  Wrist supination 5/5 5/5  (Blank rows = not tested)  HAND FUNCTION: Grip strength: Right: 38 lbs; Left: 56 lbs, Lateral pinch: Right: 12 lbs, Left: 18 lbs, and 3 point pinch: Right: 12 lbs, Left: 19 lbs 02/20/24  Grip strength: Right: 55 lbs; Left: 75 lbs, Lateral pinch: Right: 14 lbs, Left: 17 lbs, and 3 point pinch: Right: 13 lbs, Left: 21 lbs 03/04/24  Grip strength: Right: 64 lbs; Left: 75 lbs, Lateral pinch: Right: 20 lbs, Left: 17 lbs, and 3 point pinch: Right: 20 lbs, Left: 21 lbs no pain  03/11/24  Grip strength: Right: 76 lbs; Left: 84 lbs, Lateral pinch: Right: 22 lbs, Left: 19 lbs, and 3 point pinch: Right: 18 lbs, Left: 19 lbs  pain volar wrist 3 point pinch   COORDINATION: WFL  SENSATION: WFL  EDEMA: mild edema noted in dorsum of right wrist, also along MPs of the hand.  Circumferential measurements of right wrist compared to left is normal and symmetrical.  Pt reports increased edema with increased use at times and reports increased edema yesterday.    COGNITION: Overall cognitive status: Within functional limits for tasks assessed   TREATMENT DATE: 03/11/2024        Patient arrived with  cont great progress in active range of motion within normal limits.  Some tightness  in the volar flexors with endrange passive range of motion for wrist extension as well as composite finger wrist flexion   strength in supination pronation as well as radial ulnar deviation improved greatly 5 -/5. Wrist extension 5/5 pain-free Composite Wrist flexion pain 1/10 on the volar wrist with resistance Grip and pinch strength reassessed today great progress see flowsheets  Fluidotherapy: Fluidotherapy for 8 minutes to increase motion but also decrease  stiffness.  Pt encouraged to perform active motion of hand and wrist while in fluido.    Manual Therapy:  Following fluido, pt seen for manual therapy techniques with soft tissue massage and use of Graston tool Nr2 for sweeping of forearm to restore tissue mobility and to improve ROM, decrease pain.  As well as gentle traction to wrist followed by joint mobs as well as metacarpal and carpal spreads and wrist flexion extension. ADD for patient soft tissue mobilization using a right foam roller over the volar hand and wrist and forearm after contrast at home prior to stretches 20 reps  Therapeutic Exercises:  Patient to continue with contrast and Isotoner glove at nighttime Followed by soft tissue mobilization over the right roller and the volar wrist and forearm  gentle prayer stretch for composite wrist extension 10 reps pain-free Done in the clinic followed by table slides 20 reps.  Patient needed mod verbal cueing for correct form.  Pain-free. Composite digits and wrist flexion stretch 10 reps Followed by 2 pound weight for wrist supination pronation as well as radial ulnar deviation 3 sets of 12 pain-free 2 times a day Isometric strengthening for wrist extension flexion endrange 2 tti 3  sets of 12 pain-free   Patient to continue to wear Isotoner glove to use at nighttime to decrease lingering edema in right hand Patient to continue  to we neoprene Benik wrap to wear with light activities to increase motion but still decreased pain.  PATIENT EDUCATION: Education details: contrast for edema control, ROM exercises Person educated: Patient Education method: Explanation Education comprehension: verbalized understanding   GOALS: Goals reviewed with patient? Yes  SHORT TERM GOALS: Target date: 02/09/2024  Pt will demonstrate understanding of use of contrast for home program for edema control of right wrist.  Baseline: no knowledge at eval Goal status: Met  LONG TERM GOALS: Target date: 04/19/2024  Pt will demonstrate home exercise program with modified independence Baseline: no current program Goal status: Met  2.  Pt will demonstrate ability to open jars and containers independently with pain 2/10 or less. Baseline: difficulty at eval and pain noted. NOW 1/10 pain with 3-point pinch volar wrist Goal status: Progressing L  3.  Pt will demonstrate ability to complete self care tasks with modified independence with pain 2/10 or less.   Baseline: difficulty with hair care, tying shoes, pulling up pants and taking off bra. Goal status: Met  4.  Pt will demonstrate ability to open and close doors at work and home independently and pain 2/10 or less.  Baseline: difficulty with opening doors at work, increased pain when performing NOW 1-3/10 discomfort with pushing heavy door with palm Goal status: Progressing  5.    Pt will return to full duty work tasks including suspect contact independently and safely without difficulty or pain in wrist.  Baseline:  pt currently on light duty and no suspect contact. NOW still light duty working on endrange wrist flexion extension passive range of motion and strengthening conditioning Goal status: Progressing   ASSESSMENT:  CLINICAL IMPRESSION: Patient is a 33 y.o. female followed by occupational therapy for right wrist pain.  Patient made great progress from start of care.   Active range of motion within normal limits as well as strength in radial ulnar deviation and supination pronation.  Addressing endrange wrist flexion and extension PROM as well as strengthening and conditioning now.  Patient continues to have  some discomfort with 3-point pinch as well as pushing object like a heavy door at endrange wrist extension and a grip and 3-point pinch with wrist flexion.  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  She has difficulty with opening doors at work, light pain with writing.  Goal is to initiate more weightbearing as well as increase strengthening the next sessions.  Continue with current home program to decrease pain, increase motion and strength. She presents with right wrist pain, decreased strength and ROM.  She would benefit from skilled OT services to maximize her independence and safety with tasks at work, home and in the community.    PERFORMANCE DEFICITS: in functional skills including ADLs, IADLs, edema, ROM, strength, and pain, and psychosocial skills including environmental adaptation, habits, and routines and behaviors.   IMPAIRMENTS: are limiting patient from ADLs, IADLs, work, and leisure.   COMORBIDITIES: has no other co-morbidities that affects occupational performance. Patient will benefit from skilled OT to address above impairments and improve overall function.  MODIFICATION OR ASSISTANCE TO COMPLETE EVALUATION: No modification of tasks or assist necessary to complete an evaluation.  OT OCCUPATIONAL PROFILE AND HISTORY: Problem focused assessment: Including review of records relating to presenting problem.  CLINICAL DECISION MAKING: Moderate - several treatment options, min-mod task modification necessary  REHAB POTENTIAL: Excellent  EVALUATION COMPLEXITY: Low  PLAN:  OT FREQUENCY: 1-2x/week  OT DURATION: 6 weeks  PLANNED  INTERVENTIONS: 97168 OT Re-evaluation, 97535 self care/ADL training, 02889 therapeutic exercise, 97530 therapeutic activity, 97112 neuromuscular re-education, 97035 ultrasound, 97018 paraffin, 02960 fluidotherapy, 97010 moist heat, 97010 cryotherapy, and 97034 contrast bath  CONSULTED AND AGREED WITH PLAN OF CARE: Patient   Deland tedra Ip OTR/L,CLT  03/11/2024, 9:30 AM

## 2024-03-21 ENCOUNTER — Ambulatory Visit: Admitting: Occupational Therapy

## 2024-03-21 DIAGNOSIS — M25531 Pain in right wrist: Secondary | ICD-10-CM

## 2024-03-21 DIAGNOSIS — M6281 Muscle weakness (generalized): Secondary | ICD-10-CM | POA: Diagnosis not present

## 2024-03-21 NOTE — Therapy (Signed)
 OUTPATIENT OCCUPATIONAL THERAPY ORTHO TREATMENT  Patient Name: Hayley Avila MRN: [id number] DOB:June 17, 1991, 33 y.o., female Today's Date: 03/21/2024  PCP: none listed REFERRING PROVIDER: Claudene Ingle, MARLA PA-C  END OF SESSION:  OT End of Session - 03/21/24 0854     Visit Number 8    Number of Visits 13    Date for OT Re-Evaluation 04/19/24    OT Start Time 0818    OT Stop Time 0852    OT Time Calculation (min) 34 min    Activity Tolerance Patient tolerated treatment well    Behavior During Therapy [facility] for tasks assessed/performed           Past Medical History:  Diagnosis Date   Asthma    Heart murmur    S/P ACL surgery 2018   Past Surgical History:  Procedure Laterality Date   ARTHROSCOPIC REPAIR ACL Right 2018   EYE SURGERY Right 2009   Cyst removed from schlers   Patient Active Problem List   Diagnosis Date Noted   Insomnia 03/07/2024   Allergies 12/19/2023   Pain in joint of right knee 12/20/2021   Contusion of right knee 03/20/2019   Knee pain 03/18/2019   Sprain of medial collateral ligament of right knee 03/18/2019   History of repair of ACL 03/18/2019    ONSET DATE: 12/27/2023  REFERRING DIAG: right wrist pain  THERAPY DIAG:  Muscle weakness (generalized)  Pain in right wrist  Rationale for Evaluation and Treatment: Rehabilitation  SUBJECTIVE:   SUBJECTIVE STATEMENT: I am still wearing the soft wrist wrap during the day at work.  But I have not had any pain the last few days.  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  She has been wearing a wrist brace on the right during activities at work and home but the pain has not changed or lessened over time.  PRECAUTIONS:  Other: light duty, no suspect contact, limit lifting and pulling motions  RED FLAGS: None   WEIGHT BEARING RESTRICTIONS: Yes    PAIN:  Are you having pain?  1/10 volar wrist with 3-point pinch  FALLS: Has patient fallen in last 6 months? No  LIVING ENVIRONMENT: Lives with: lives with their spouse Lives in: House/apartment Has following equipment at home: None  PLOF: Independent  PATIENT GOALS: Pt  would like to return to previous level of full independence at home and work  NEXT MD VISIT: TBA  OBJECTIVE:  Note: Objective measures were completed at Evaluation unless otherwise noted.  HAND DOMINANCE: Right  ADLs: Overall ADLs: Pt currently wearing wrist brace for support during all self care, IADL and work tasks.  She removes for shower and also sleeps with brace.  Pt reports she has difficulty with opening containers, taking off her bra, grabbing items, tying shoes, performing hair care, pulling up pants, cooking, laundry and being able to work out with weights.  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  She has difficulty with opening doors, light pain with writing.  She denies any issues with typing at the moment, able to manage her duty weapon when  wearing her brace and has changed her gun holster to increase ease of removing weapon.    FUNCTIONAL OUTCOME MEASURES: TBA  UPPER EXTREMITY ROM:     Active ROM Right eval Left eval R 01/30/24 R 02/09/24 R 02/20/24 R 03/04/24  Shoulder flexion WNL WNL      Shoulder abduction WNL WNL      Shoulder adduction        Shoulder extension        Shoulder internal rotation WNL WNL      Shoulder external rotation WNL WNL      Elbow flexion WNL WNL      Elbow extension WNL WNL      Wrist flexion 74 80 80 84 100 100 pain 1/10 volar wrist   Wrist extension 40 60 55/ 60 close fist 62/60 closed fist 70 70  Wrist ulnar  deviation 20 WNL 30 35 35 35  Wrist radial deviation 18 WNL 20 24 25 25   Wrist pronation 90 WNL 90  90 90  Wrist supination 90 WNL 90  90 90  (Blank rows = not tested)  Full fisting on right and able to demonstrate oppositional movements to small finger and base of small finger.   UPPER EXTREMITY MMT:     MMT Right eval Left eval  Shoulder flexion WNL WNL  Shoulder abduction WNL WNL  Shoulder adduction    Shoulder extension    Shoulder internal rotation WNL WNL  Shoulder external rotation WNL WNL  Middle trapezius    Lower trapezius    Elbow flexion WNL WNL  Elbow extension WNL WNL  Wrist flexion 4/5 5/5  Wrist extension 4/5 5/5  Wrist ulnar deviation 4/5 5/5  Wrist radial deviation 4/5 5/5  Wrist pronation 5/5 5/5  Wrist supination 5/5 5/5  (Blank rows = not tested)  HAND FUNCTION: Grip strength: Right: 38 lbs; Left: 56 lbs, Lateral pinch: Right: 12 lbs, Left: 18 lbs, and 3 point pinch: Right: 12 lbs, Left: 19 lbs 02/20/24  Grip strength: Right: 55 lbs; Left: 75 lbs, Lateral pinch: Right: 14 lbs, Left: 17 lbs, and 3 point pinch: Right: 13 lbs, Left: 21 lbs 03/04/24  Grip strength: Right: 64 lbs; Left: 75 lbs, Lateral pinch: Right: 20 lbs, Left: 17 lbs, and 3 point pinch: Right: 20 lbs, Left: 21 lbs no pain  03/11/24  Grip strength: Right: 76 lbs; Left: 69 lbs, Lateral pinch: Right: 22 lbs, Left: 19 lbs, and 3 point pinch: Right: 18 lbs, Left: 19 lbs    COORDINATION: WFL  SENSATION: WFL  EDEMA: mild edema noted in dorsum of right wrist, also along MPs of the hand.  Circumferential measurements of right wrist compared to left is normal and symmetrical.  Pt reports increased edema with increased use at times and reports increased edema yesterday.    COGNITION: Overall cognitive status: Within functional limits for tasks assessed   TREATMENT DATE: 03/21/2024        Patient arrived with reports of no pain last few days.  Still wearing neoprene wrap for the wrist during the  day at work. Active range of motion in right wrist within normal limits Manual muscle test no pain-strength 5- /5  as well as grip and prehension no pain.  Therapeutic Exercises:  Patient to continue with pending neoprene at work as needed and Isotoner glove at night as needed. Initiated flex bar 5 pounds for supination pronation as well as ulnar radial deviation and wrist extension 15 reps pain-free twice a day Patient can increase in 2 to 3 days if pain-free 2nd set and in 6 days third set if pain-free Putty medium for gripping 20 reps twice a day as well as 3 point pinch 20 reps twice a day Can increase to 40 reps in 3 days if pain-free  Patient also started on forearm planks.  As well as sideway planks. PATIENT EDUCATION: Education details: contrast for edema control, ROM exercises Person educated: Patient Education method: Explanation Education comprehension: verbalized understanding   GOALS: Goals reviewed with patient? Yes  SHORT TERM GOALS: Target date: 02/09/2024  Pt will demonstrate understanding of use of contrast for home program for edema control of right wrist.  Baseline: no knowledge at eval Goal status: Met  LONG TERM GOALS: Target date: 04/19/2024  Pt will demonstrate home exercise program with modified independence Baseline: no current program Goal status: Met  2.  Pt will demonstrate ability to open jars and containers independently with pain 2/10 or less. Baseline: difficulty at eval and pain noted. NOW 1/10 pain with 3-point pinch volar wrist Goal status: Progressing L  3.  Pt will demonstrate ability to complete self care tasks with modified independence with pain 2/10 or less.   Baseline: difficulty with hair care, tying shoes, pulling up pants and taking off bra. Goal status: Met  4.  Pt will demonstrate ability to open and close doors at work and  home independently and pain 2/10 or less.  Baseline: difficulty with opening doors at work, increased pain when performing NOW 1-3/10 discomfort with pushing heavy door with palm Goal status: Progressing  5.    Pt will return to full duty work tasks including suspect contact independently and safely without difficulty or pain in wrist.  Baseline:  pt currently on light duty and no suspect contact. NOW still light duty working on endrange wrist flexion extension passive range of motion and strengthening conditioning Goal status: Progressing   ASSESSMENT:  CLINICAL IMPRESSION: Patient is a 33 y.o. female followed by occupational therapy for right wrist pain.  Patient made great progress from start of care.  Patient report had no pain in the last few days.  Patient was able to tolerate manual muscle testing pain-free in all ranges.  Initiate strengthening with 5 pounds flex bar in all ranges except wrist flexion.  Patient can increase reps and sets over the next 7 to 10 days.  Patient can start forearm planks.  As well as medium putty for gripping and 3 point pinch.  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  She has difficulty with opening doors at work, light pain with writing.  Goal is to initiate more weightbearing as well as increase strengthening the next sessions.  Continue with current home program to decrease pain, increase motion and strength. She presents with right wrist pain, decreased strength and ROM.  She would benefit from skilled OT services to maximize her independence and safety with tasks at work, home and in the community.    PERFORMANCE DEFICITS: in functional skills including ADLs, IADLs, edema, ROM, strength, and pain, and psychosocial skills including environmental adaptation, habits, and routines and behaviors.  IMPAIRMENTS: are limiting patient from ADLs, IADLs,  work, and leisure.   COMORBIDITIES: has no other co-morbidities that affects occupational performance. Patient will benefit from skilled OT to address above impairments and improve overall function.  MODIFICATION OR ASSISTANCE TO COMPLETE EVALUATION: No modification of tasks or assist necessary to complete an evaluation.  OT OCCUPATIONAL PROFILE AND HISTORY: Problem focused assessment: Including review of records relating to presenting problem.  CLINICAL DECISION MAKING: Moderate - several treatment options, min-mod task modification necessary  REHAB POTENTIAL: Excellent  EVALUATION COMPLEXITY: Low  PLAN:  OT FREQUENCY: 1-2x/week  OT DURATION: 6 weeks  PLANNED INTERVENTIONS: 97168 OT Re-evaluation, 97535 self care/ADL training, 02889 therapeutic exercise, 97530 therapeutic activity, 97112 neuromuscular re-education, 97035 ultrasound, 97018 paraffin, 02960 fluidotherapy, 97010 moist heat, 97010 cryotherapy, and 97034 contrast bath  CONSULTED AND AGREED WITH PLAN OF CARE: Patient   Deland tedra Ip OTR/L,CLT  03/21/2024, 8:55 AM

## 2024-03-29 ENCOUNTER — Ambulatory Visit: Admitting: Occupational Therapy

## 2024-03-29 DIAGNOSIS — M6281 Muscle weakness (generalized): Secondary | ICD-10-CM

## 2024-03-29 DIAGNOSIS — M25531 Pain in right wrist: Secondary | ICD-10-CM

## 2024-03-29 NOTE — Therapy (Signed)
 OUTPATIENT OCCUPATIONAL THERAPY ORTHO TREATMENT  Patient Name: Hayley Avila MRN: [id number] DOB:October 24, 1990, 33 y.o., female Today's Date: 03/29/2024  PCP: none listed REFERRING PROVIDER: Claudene Ingle, MARLA PA-C  END OF SESSION:  OT End of Session - 03/29/24 0815     Visit Number 9    Number of Visits 13    Date for OT Re-Evaluation 04/19/24    OT Start Time 0815    Activity Tolerance Patient tolerated treatment well    Behavior During Therapy [facility] for tasks assessed/performed           Past Medical History:  Diagnosis Date   Asthma    Heart murmur    S/P ACL surgery 2018   Past Surgical History:  Procedure Laterality Date   ARTHROSCOPIC REPAIR ACL Right 2018   EYE SURGERY Right 2009   Cyst removed from schlers   Patient Active Problem List   Diagnosis Date Noted   Insomnia 03/07/2024   Allergies 12/19/2023   Pain in joint of right knee 12/20/2021   Contusion of right knee 03/20/2019   Knee pain 03/18/2019   Sprain of medial collateral ligament of right knee 03/18/2019   History of repair of ACL 03/18/2019    ONSET DATE: 12/27/2023  REFERRING DIAG: right wrist pain  THERAPY DIAG:  Muscle weakness (generalized)  Pain in right wrist  Rationale for Evaluation and Treatment: Rehabilitation  SUBJECTIVE:   SUBJECTIVE STATEMENT: I did not had any pain in the last week.  And I did get the flex bar to work on.  The 5 pound 1.   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  She has been wearing a wrist brace on the right during activities at work and home but the pain has not changed or lessened over time.  PRECAUTIONS: Other: light duty, no suspect contact, limit lifting and pulling motions  RED FLAGS: None   WEIGHT  BEARING RESTRICTIONS: Yes    PAIN:  Are you having pain?  1/10 volar wrist with 3-point pinch  FALLS: Has patient fallen in last 6 months? No  LIVING ENVIRONMENT: Lives with: lives with their spouse Lives in: House/apartment Has following equipment at home: None  PLOF: Independent  PATIENT GOALS: Pt  would like to return to previous level of full independence at home and work  NEXT MD VISIT: TBA  OBJECTIVE:  Note: Objective measures were completed at Evaluation unless otherwise noted.  HAND DOMINANCE: Right  ADLs: Overall ADLs: Pt currently wearing wrist brace for support during all self care, IADL and work tasks.  She removes for shower and also sleeps with brace.  Pt reports she has difficulty with opening containers, taking off her bra, grabbing items, tying shoes, performing hair care, pulling up pants, cooking, laundry and being able to work out with weights.  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  She has difficulty with opening doors, light pain with writing.  She denies any issues with typing at the moment, able to manage her duty weapon when wearing her brace and has changed her gun holster to increase ease of removing weapon.    FUNCTIONAL OUTCOME MEASURES: TBA  UPPER EXTREMITY ROM:     Active ROM Right eval Left eval R 01/30/24 R 02/09/24 R 02/20/24 R 03/04/24  Shoulder flexion WNL WNL      Shoulder abduction WNL WNL      Shoulder adduction        Shoulder extension        Shoulder internal rotation WNL WNL      Shoulder external rotation WNL WNL      Elbow flexion WNL WNL      Elbow extension WNL WNL      Wrist flexion 74 80 80 84 100 100 pain 1/10 volar wrist   Wrist extension 40 60 55/ 60 close fist 62/60 closed fist 70 70  Wrist ulnar deviation 20 WNL 30 35 35 35  Wrist radial deviation 18 WNL 20 24 25 25   Wrist pronation 90 WNL 90  90 90   Wrist supination 90 WNL 90  90 90  (Blank rows = not tested)  Full fisting on right and able to demonstrate oppositional movements to small finger and base of small finger.   UPPER EXTREMITY MMT:     MMT Right eval Left eval  Shoulder flexion WNL WNL  Shoulder abduction WNL WNL  Shoulder adduction    Shoulder extension    Shoulder internal rotation WNL WNL  Shoulder external rotation WNL WNL  Middle trapezius    Lower trapezius    Elbow flexion WNL WNL  Elbow extension WNL WNL  Wrist flexion 4/5 5/5  Wrist extension 4/5 5/5  Wrist ulnar deviation 4/5 5/5  Wrist radial deviation 4/5 5/5  Wrist pronation 5/5 5/5  Wrist supination 5/5 5/5  (Blank rows = not tested)  HAND FUNCTION: Grip strength: Right: 38 lbs; Left: 56 lbs, Lateral pinch: Right: 12 lbs, Left: 18 lbs, and 3 point pinch: Right: 12 lbs, Left: 19 lbs 02/20/24  Grip strength: Right: 55 lbs; Left: 75 lbs, Lateral pinch: Right: 14 lbs, Left: 17 lbs, and 3 point pinch: Right: 13 lbs, Left: 21 lbs 03/04/24  Grip strength: Right: 64 lbs; Left: 75 lbs, Lateral pinch: Right: 20 lbs, Left: 17 lbs, and 3 point pinch: Right: 20 lbs, Left: 21 lbs no pain  03/11/24  Grip strength: Right: 76 lbs; Left: 69 lbs, Lateral pinch: Right: 22 lbs, Left: 19 lbs, and 3 point pinch: Right: 18 lbs, Left: 19 lbs   03/29/24  Grip strength: Right: 90 lbs; Left: 80 lbs, Lateral pinch: Right: 22 lbs, Left: 19 lbs, and 3 point pinch: Right: 21 lbs, Left: 22 lbs    COORDINATION: WFL  SENSATION: WFL  EDEMA: mild edema noted in dorsum of right wrist, also along MPs of the hand.  Circumferential measurements of right wrist compared to left is normal and symmetrical.  Pt reports increased edema with increased use at times and reports increased edema yesterday.    COGNITION: Overall cognitive status: Within functional limits for tasks assessed   TREATMENT DATE: 08/2192025        Patient is active range of motion within normal limits. Strength in  wrist 5/5 pain-free Patient is less fearful and less protective during strength testing Grip and prehension strength improved.  See flowsheet.                                  Therapeutic Exercises:  Biodex done 20 pounds for chest press, rowing, and pull downs.  2 sets of 15 Tricep  donning 15 pounds 2 sets of 15 pain-free  5 pound weights for flies, over press, bicep and tricep 2 sets of 15 Was able to do rows with 10 pounds 15 reps Through one-handed 1 kg ball on rebounder catching with both 2 x 1 minute  Chest through bilateral 2 kg ball catch and throw bilateral 2 x 1 minute All symptom-free. 3 x 15 Wall push-ups pain-free 3 x 30-second plank on forearm pain-free.    PATIENT EDUCATION: Education details: contrast for edema control, ROM exercises Person educated: Patient Education method: Explanation Education comprehension: verbalized understanding   GOALS: Goals reviewed with patient? Yes  SHORT TERM GOALS: Target date: 02/09/2024  Pt will demonstrate understanding of use of contrast for home program for edema control of right wrist.  Baseline: no knowledge at eval Goal status: Met  LONG TERM GOALS: Target date: 04/19/2024  Pt will demonstrate home exercise program with modified independence Baseline: no current program Goal status: Met  2.  Pt will demonstrate ability to open jars and containers independently with pain 2/10 or less. Baseline: difficulty at eval and pain noted. NOW 1/10 pain with 3-point pinch volar wrist Goal status: Progressing L  3.  Pt will demonstrate ability to complete self care tasks with modified independence with pain 2/10 or less.   Baseline: difficulty with hair care, tying shoes, pulling up pants and taking off bra. Goal status: Met  4.  Pt will demonstrate ability to open and close doors at work and home independently and pain 2/10 or less.  Baseline: difficulty with opening doors at work, increased pain when performing NOW 1-3/10  discomfort with pushing heavy door with palm Goal status: Progressing  5.    Pt will return to full duty work tasks including suspect contact independently and safely without difficulty or pain in wrist.  Baseline:  pt currently on light duty and no suspect contact. NOW still light duty working on endrange wrist flexion extension passive range of motion and strengthening conditioning Goal status: Progressing   ASSESSMENT:  CLINICAL IMPRESSION: Patient is a 33 y.o. female followed by occupational therapy for right wrist pain.  Patient made great progress from start of care.  Patient report had no pain in the last few days.  Patient was able to tolerate manual muscle testing pain-free in all ranges.  Initiate strengthening on Biodex with 15 to 20 pounds.  As well as wall push-ups, 1 kg and 2 kg ball on rebounder and planks.  Patient tolerated well upgrade her home program program.  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  She has difficulty with opening doors at work, light pain with writing.  Goal is to initiate more weightbearing as well as increase strengthening the next sessions.  Continue with current home program to decrease pain, increase motion and strength. She presents with right wrist pain, decreased strength and ROM.  She would benefit from skilled OT services to maximize her independence and safety with tasks at work, home and in the community.    PERFORMANCE DEFICITS: in functional skills including ADLs, IADLs, edema, ROM, strength, and pain, and psychosocial skills including environmental adaptation, habits, and routines and behaviors.   IMPAIRMENTS: are limiting patient from ADLs, IADLs, work, and leisure.   COMORBIDITIES: has no other co-morbidities that affects occupational performance. Patient will benefit from skilled OT to address above impairments and improve  overall  function.  MODIFICATION OR ASSISTANCE TO COMPLETE EVALUATION: No modification of tasks or assist necessary to complete an evaluation.  OT OCCUPATIONAL PROFILE AND HISTORY: Problem focused assessment: Including review of records relating to presenting problem.  CLINICAL DECISION MAKING: Moderate - several treatment options, min-mod task modification necessary  REHAB POTENTIAL: Excellent  EVALUATION COMPLEXITY: Low  PLAN:  OT FREQUENCY: 1-2x/week  OT DURATION: 6 weeks  PLANNED INTERVENTIONS: 97168 OT Re-evaluation, 97535 self care/ADL training, 02889 therapeutic exercise, 97530 therapeutic activity, 97112 neuromuscular re-education, 97035 ultrasound, 97018 paraffin, 02960 fluidotherapy, 97010 moist heat, 97010 cryotherapy, and 97034 contrast bath  CONSULTED AND AGREED WITH PLAN OF CARE: Patient   Deland tedra Ip OTR/L,CLT  03/29/2024, 8:16 AM

## 2024-04-04 ENCOUNTER — Ambulatory Visit: Payer: Self-pay | Admitting: Physician Assistant

## 2024-04-04 ENCOUNTER — Encounter: Payer: Self-pay | Admitting: Physician Assistant

## 2024-04-04 VITALS — BP 146/84 | HR 84 | Temp 97.3°F | Resp 14

## 2024-04-04 DIAGNOSIS — S63501D Unspecified sprain of right wrist, subsequent encounter: Secondary | ICD-10-CM

## 2024-04-04 NOTE — Progress Notes (Signed)
   Subjective: Right wrist pain    Patient ID: Hayley Avila, female    DOB: [birth year], 33 y.o.   MRN: [id number]  HPI Patient follow-up for right wrist pain which occurred in May 2025 secondary to physical training.  X-ray was negative.  Patient has since been evaluated and treated by occupational therapy.  Patient has made remarkable improvement but is still not at full duty capacity for occupational therapy evaluation..  Patient comes today for reevaluation for work status.   Review of Systems Negative except for above complaint    Objective:   Physical Exam BP 146/84  Pulse Rate 84  Temp 97.3 F (36.3 C)  Resp 14  SpO2 94 %  Pain Score 2  Pain Loc Hand  Physical exam deferred.  Discussed occupational therapy evaluation notes.       Assessment & Plan: Sprain right wrist  Patient return back to light duty and will continue occupational therapy and follow-up in 1 month.

## 2024-04-04 NOTE — Progress Notes (Signed)
 Workers comp follow up R wrist and feels OT is very effective and helping her be stronger.  She is able to complete her duties with light duty desk and feels a few more weeks of OT and light duty will help prepare her for returning full duty and the fitness test required to return to full duty involving push ups.  Pain level about a 2 she reports to R wrist.

## 2024-04-08 ENCOUNTER — Ambulatory Visit: Payer: Worker's Compensation | Attending: Physician Assistant | Admitting: Occupational Therapy

## 2024-04-08 DIAGNOSIS — M6281 Muscle weakness (generalized): Secondary | ICD-10-CM | POA: Insufficient documentation

## 2024-04-08 DIAGNOSIS — M25531 Pain in right wrist: Secondary | ICD-10-CM | POA: Diagnosis present

## 2024-04-08 NOTE — Therapy (Signed)
 OUTPATIENT OCCUPATIONAL THERAPY ORTHO TREATMENT/10th visit  Patient Name: Hayley Avila MRN: [id number] DOB:03-29-1991, 33 y.o., female Today's Date: 04/08/2024  PCP: none listed REFERRING PROVIDER: Claudene Ingle, MARLA PA-C  END OF SESSION:  OT End of Session - 04/08/24 0827     Visit Number 10    Number of Visits 13    Date for OT Re-Evaluation 04/19/24    OT Start Time 0821    OT Stop Time 0859    OT Time Calculation (min) 38 min    Activity Tolerance Patient tolerated treatment well    Behavior During Therapy [facility] for tasks assessed/performed           Past Medical History:  Diagnosis Date   Asthma    Heart murmur    S/P ACL surgery 2018   Past Surgical History:  Procedure Laterality Date   ARTHROSCOPIC REPAIR ACL Right 2018   EYE SURGERY Right 2009   Cyst removed from [place]lers   Patient Active Problem List   Diagnosis Date Noted   Insomnia 03/07/2024   Allergies 12/19/2023   Pain in joint of right knee 12/20/2021   Contusion of right knee 03/20/2019   Knee pain 03/18/2019   Sprain of medial collateral ligament of right knee 03/18/2019   History of repair of ACL 03/18/2019    ONSET DATE: 12/27/2023  REFERRING DIAG: right wrist pain  THERAPY DIAG:  Muscle weakness (generalized)  Pain in right wrist  Rationale for Evaluation and Treatment: Rehabilitation  SUBJECTIVE:   SUBJECTIVE STATEMENT: I worked out ones or 2 x but did do a lot of yard work and did not had any pain or at work [employer] operational officer , pushing and pulling doors Pt accompanied :self  PERTINENT HISTORY: 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  She has been wearing a wrist brace on the right during activities at work and home but the pain has not changed or lessened over time.  PRECAUTIONS: Other:  light duty, no suspect contact, limit lifting and pulling motions  RED FLAGS: None   WEIGHT BEARING RESTRICTIONS: Yes    PAIN:  Are you having pain? No pain  FALLS: Has patient fallen in last 6 months? No  LIVING ENVIRONMENT: Lives with: lives with their spouse Lives in: House/apartment Has following equipment at home: None  PLOF: Independent  PATIENT GOALS: Pt  would like to return to previous level of full independence at home and work  NEXT MD VISIT: TBA  OBJECTIVE:  Note: Objective measures were completed at Evaluation unless otherwise noted.  HAND DOMINANCE: Right  ADLs: Overall ADLs: Pt currently wearing wrist brace for support during all self care, IADL and work tasks.  She removes for shower and also sleeps with brace.  Pt reports she has difficulty with opening containers, taking off her bra, grabbing items, tying shoes, performing hair care, pulling up pants, cooking, laundry and being able to work out with weights.  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  She has difficulty with opening doors, light pain with writing.  She denies any issues with typing at the moment, able to manage her duty weapon when wearing her brace and has changed her gun holster  to increase ease of removing weapon.    FUNCTIONAL OUTCOME MEASURES: TBA  UPPER EXTREMITY ROM:     Active ROM Right eval Left eval R 01/30/24 R 02/09/24 R 02/20/24 R 03/04/24  Shoulder flexion WNL WNL      Shoulder abduction WNL WNL      Shoulder adduction        Shoulder extension        Shoulder internal rotation WNL WNL      Shoulder external rotation WNL WNL      Elbow flexion WNL WNL      Elbow extension WNL WNL      Wrist flexion 74 80 80 84 100 100 pain 1/10 volar wrist   Wrist extension 40 60 55/ 60 close fist 62/60 closed fist 70 70  Wrist ulnar deviation 20 WNL 30 35 35 35  Wrist  radial deviation 18 WNL 20 24 25 25   Wrist pronation 90 WNL 90  90 90  Wrist supination 90 WNL 90  90 90  (Blank rows = not tested)  Full fisting on right and able to demonstrate oppositional movements to small finger and base of small finger.   UPPER EXTREMITY MMT:     MMT Right eval Left eval  Shoulder flexion WNL WNL  Shoulder abduction WNL WNL  Shoulder adduction    Shoulder extension    Shoulder internal rotation WNL WNL  Shoulder external rotation WNL WNL  Middle trapezius    Lower trapezius    Elbow flexion WNL WNL  Elbow extension WNL WNL  Wrist flexion 4/5 5/5  Wrist extension 4/5 5/5  Wrist ulnar deviation 4/5 5/5  Wrist radial deviation 4/5 5/5  Wrist pronation 5/5 5/5  Wrist supination 5/5 5/5  (Blank rows = not tested)  HAND FUNCTION: Grip strength: Right: 38 lbs; Left: 56 lbs, Lateral pinch: Right: 12 lbs, Left: 18 lbs, and 3 point pinch: Right: 12 lbs, Left: 19 lbs 02/20/24  Grip strength: Right: 55 lbs; Left: 75 lbs, Lateral pinch: Right: 14 lbs, Left: 17 lbs, and 3 point pinch: Right: 13 lbs, Left: 21 lbs 03/04/24  Grip strength: Right: 64 lbs; Left: 75 lbs, Lateral pinch: Right: 20 lbs, Left: 17 lbs, and 3 point pinch: Right: 20 lbs, Left: 21 lbs no pain  03/11/24  Grip strength: Right: 76 lbs; Left: 69 lbs, Lateral pinch: Right: 22 lbs, Left: 19 lbs, and 3 point pinch: Right: 18 lbs, Left: 19 lbs   03/29/24  Grip strength: Right: 90 lbs; Left: 80 lbs, Lateral pinch: Right: 22 lbs, Left: 19 lbs, and 3 point pinch: Right: 21 lbs, Left: 22 lbs   04/08/24  Grip strength: Right: 100 lbs; Left: 85 lbs, Lateral pinch: Right: 24 lbs, Left: 20 lbs, and 3 point pinch: Right: 25 lbs, Left: 24 lbs   COORDINATION: WFL  SENSATION: WFL  EDEMA: mild edema noted in dorsum of right wrist, also along MPs of the hand.  Circumferential measurements of right wrist compared to left is normal and symmetrical.  Pt reports increased edema with increased use at times and reports  increased edema yesterday.    COGNITION: Overall cognitive status: Within functional limits for tasks assessed   TREATMENT DATE: 04/08/24        Patient is active range of motion within normal limits. Strength in wrist 5/5 pain-free Patient is less fearful and less protective during strength testing Grip and prehension strength improved.  See flowsheet.  Therapeutic Exercises:  Biodex done 35 pounds for chest press, rowing, and pull downs.  2 sets of 15 Tricep donning 25 pounds 2 sets of 15 pain-free  5 pound weights for flies, over press, bicep and tricep 2 sets of 15  rows with 10 pounds 15 reps Through one-handed 1 kg ball on rebounder catching with both 2 x 1 minute  Chest  throw bilateral 2 kg - 3 kg ball catch and throw bilateral 2 x 1 minute  2 x 15 lbs weighted ball - 12 throw on floor / pick up bilateral  Biodex trunk rotation with chest push 10 lbs - 12 reps each side All symptom-free. 3 x 15 Wall push-ups pain-free 2x 45-second plank on forearm pain-free.    PATIENT EDUCATION: Education details: contrast for edema control, ROM exercises Person educated: Patient Education method: Explanation Education comprehension: verbalized understanding   GOALS: Goals reviewed with patient? Yes  SHORT TERM GOALS: Target date: 02/09/2024  Pt will demonstrate understanding of use of contrast for home program for edema control of right wrist.  Baseline: no knowledge at eval Goal status: Met  LONG TERM GOALS: Target date: 04/19/2024  Pt will demonstrate home exercise program with modified independence Baseline: no current program Goal status: Met  2.  Pt will demonstrate ability to open jars and containers independently with pain 2/10 or less. Baseline: difficulty at eval and pain noted. NOW 1/10 pain with 3-point pinch volar wrist Goal status: Progressing L  3.  Pt will demonstrate ability to complete self care tasks with modified  independence with pain 2/10 or less.   Baseline: difficulty with hair care, tying shoes, pulling up pants and taking off bra. Goal status: Met  4.  Pt will demonstrate ability to open and close doors at work and home independently and pain 2/10 or less.  Baseline: difficulty with opening doors at work, increased pain when performing NOW 1-3/10 discomfort with pushing heavy door with palm Goal status: Progressing  5.    Pt will return to full duty work tasks including suspect contact independently and safely without difficulty or pain in wrist.  Baseline:  pt currently on light duty and no suspect contact. NOW still light duty working on endrange wrist flexion extension passive range of motion and strengthening conditioning Goal status: Progressing   ASSESSMENT:  CLINICAL IMPRESSION: Patient is a 33 y.o. female followed by occupational therapy for right wrist pain.  Patient made great progress from start of care. Pt  report no pain  the last 2 visits  - did work in the yard and had no issues-    Patient was able to tolerate manual muscle testing pain-free in all ranges and grip/ prehension increase again -pt able to tolerated increase strengthening on Biodex with 25 -35 pounds.  As well as wall push-ups, 1 -3 kg ball on rebounder and planks 45 sec.  Patient tolerated  strengthening and upgrade her home program program.  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  Continue with current home program to decrease pain, increase motion and strength. She presents with right wrist pain, decreased strength and ROM.  She would benefit from skilled OT services to maximize her independence and safety with tasks at work, home and in the community.    PERFORMANCE DEFICITS: in functional skills including ADLs, IADLs, edema, ROM, strength, and pain, and psychosocial skills  including environmental  adaptation, habits, and routines and behaviors.   IMPAIRMENTS: are limiting patient from ADLs, IADLs, work, and leisure.   COMORBIDITIES: has no other co-morbidities that affects occupational performance. Patient will benefit from skilled OT to address above impairments and improve overall function.  MODIFICATION OR ASSISTANCE TO COMPLETE EVALUATION: No modification of tasks or assist necessary to complete an evaluation.  OT OCCUPATIONAL PROFILE AND HISTORY: Problem focused assessment: Including review of records relating to presenting problem.  CLINICAL DECISION MAKING: Moderate - several treatment options, min-mod task modification necessary  REHAB POTENTIAL: Excellent  EVALUATION COMPLEXITY: Low  PLAN:  OT FREQUENCY: 1-2x/week  OT DURATION: 6 weeks  PLANNED INTERVENTIONS: 97168 OT Re-evaluation, 97535 self care/ADL training, 02889 therapeutic exercise, 97530 therapeutic activity, 97112 neuromuscular re-education, 97035 ultrasound, 97018 paraffin, 02960 fluidotherapy, 97010 moist heat, 97010 cryotherapy, and 97034 contrast bath  CONSULTED AND AGREED WITH PLAN OF CARE: Patient   Deland tedra Ip OTR/L,CLT  04/08/2024, 8:59 AM

## 2024-04-18 ENCOUNTER — Ambulatory Visit: Payer: Worker's Compensation | Admitting: Occupational Therapy

## 2024-04-29 ENCOUNTER — Ambulatory Visit: Payer: Worker's Compensation | Admitting: Occupational Therapy

## 2024-04-29 DIAGNOSIS — M6281 Muscle weakness (generalized): Secondary | ICD-10-CM | POA: Diagnosis not present

## 2024-04-29 DIAGNOSIS — M25531 Pain in right wrist: Secondary | ICD-10-CM

## 2024-04-29 NOTE — Therapy (Signed)
 OUTPATIENT OCCUPATIONAL THERAPY ORTHO TREATMENT/RECERT  Patient Name: Hayley Avila MRN: [id number] DOB:[birth year], 33 y.o., female Today's Date: 04/29/2024  PCP: none listed REFERRING PROVIDER: Claudene Ingle, MARLA PA-C  END OF SESSION:  OT End of Session - 04/29/24 0822     Visit Number 11    Number of Visits 13    Date for Recertification  05/27/24    OT Start Time 0816    OT Stop Time 0855    OT Time Calculation (min) 39 min    Activity Tolerance Patient tolerated treatment well    Behavior During Therapy [facility] for tasks assessed/performed           Past Medical History:  Diagnosis Date   Asthma    Heart murmur    S/P ACL surgery 2018   Past Surgical History:  Procedure Laterality Date   ARTHROSCOPIC REPAIR ACL Right 2018   EYE SURGERY Right 2009   Cyst removed from schlers   Patient Active Problem List   Diagnosis Date Noted   Insomnia 03/07/2024   Allergies 12/19/2023   Pain in joint of right knee 12/20/2021   Contusion of right knee 03/20/2019   Knee pain 03/18/2019   Sprain of medial collateral ligament of right knee 03/18/2019   History of repair of ACL 03/18/2019    ONSET DATE: 12/27/2023  REFERRING DIAG: right wrist pain  THERAPY DIAG:  Muscle weakness (generalized)  Pain in right wrist  Rationale for Evaluation and Treatment: Rehabilitation  SUBJECTIVE:   SUBJECTIVE STATEMENT: I am doing good - no pain since last time and has been doing to workouts-I am seeing the doctor again next week 7 October Pt accompanied :self  PERTINENT HISTORY: 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  She has been wearing a wrist brace on the right during activities at work and home but the pain has not changed or lessened over time.  PRECAUTIONS: Other: light duty,  no suspect contact, limit lifting and pulling motions  RED FLAGS: None   WEIGHT BEARING RESTRICTIONS: Yes    PAIN:  Are you having pain? No pain  FALLS: Has patient fallen in last 6 months? No  LIVING ENVIRONMENT: Lives with: lives with their spouse Lives in: House/apartment Has following equipment at home: None  PLOF: Independent  PATIENT GOALS: Pt  would like to return to previous level of full independence at home and work  NEXT MD VISIT: TBA  OBJECTIVE:  Note: Objective measures were completed at Evaluation unless otherwise noted.  HAND DOMINANCE: Right  ADLs: Overall ADLs: Pt currently wearing wrist brace for support during all self care, IADL and work tasks.  She removes for shower and also sleeps with brace.  Pt reports she has difficulty with opening containers, taking off her bra, grabbing items, tying shoes, performing hair care, pulling up pants, cooking, laundry and being able to work out with weights.  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  She has difficulty with opening doors, light pain with writing.  She denies any issues with typing at the moment, able to manage her duty weapon when wearing her brace and has changed her gun holster to increase ease of removing weapon.  FUNCTIONAL OUTCOME MEASURES: TBA  UPPER EXTREMITY ROM:     Active ROM Right eval Left eval R 01/30/24 R 02/09/24 R 02/20/24 R 03/04/24  Shoulder flexion WNL WNL      Shoulder abduction WNL WNL      Shoulder adduction        Shoulder extension        Shoulder internal rotation WNL WNL      Shoulder external rotation WNL WNL      Elbow flexion WNL WNL      Elbow extension WNL WNL      Wrist flexion 74 80 80 84 100 100 pain 1/10 volar wrist   Wrist extension 40 60 55/ 60 close fist 62/60 closed fist 70 70  Wrist ulnar deviation 20 WNL 30 35 35 35  Wrist radial deviation  18 WNL 20 24 25 25   Wrist pronation 90 WNL 90  90 90  Wrist supination 90 WNL 90  90 90  (Blank rows = not tested)  Full fisting on right and able to demonstrate oppositional movements to small finger and base of small finger.   UPPER EXTREMITY MMT:     MMT Right eval Left eval  Shoulder flexion WNL WNL  Shoulder abduction WNL WNL  Shoulder adduction    Shoulder extension    Shoulder internal rotation WNL WNL  Shoulder external rotation WNL WNL  Middle trapezius    Lower trapezius    Elbow flexion WNL WNL  Elbow extension WNL WNL  Wrist flexion 4/5 5/5  Wrist extension 4/5 5/5  Wrist ulnar deviation 4/5 5/5  Wrist radial deviation 4/5 5/5  Wrist pronation 5/5 5/5  Wrist supination 5/5 5/5  (Blank rows = not tested)  HAND FUNCTION: Grip strength: Right: 38 lbs; Left: 56 lbs, Lateral pinch: Right: 12 lbs, Left: 18 lbs, and 3 point pinch: Right: 12 lbs, Left: 19 lbs 02/20/24  Grip strength: Right: 55 lbs; Left: 75 lbs, Lateral pinch: Right: 14 lbs, Left: 17 lbs, and 3 point pinch: Right: 13 lbs, Left: 21 lbs 03/04/24  Grip strength: Right: 64 lbs; Left: 75 lbs, Lateral pinch: Right: 20 lbs, Left: 17 lbs, and 3 point pinch: Right: 20 lbs, Left: 21 lbs no pain  03/11/24  Grip strength: Right: 76 lbs; Left: 69 lbs, Lateral pinch: Right: 22 lbs, Left: 19 lbs, and 3 point pinch: Right: 18 lbs, Left: 19 lbs   03/29/24  Grip strength: Right: 90 lbs; Left: 80 lbs, Lateral pinch: Right: 22 lbs, Left: 19 lbs, and 3 point pinch: Right: 21 lbs, Left: 22 lbs   04/08/24  Grip strength: Right: 100 lbs; Left: 85 lbs, Lateral pinch: Right: 24 lbs, Left: 20 lbs, and 3 point pinch: Right: 25 lbs, Left: 24 lbs  04/29/24  Grip strength: Right: 100 lbs; Left: 95 lbs, Lateral pinch: Right: 25 lbs, Left: 20 lbs, and 3 point pinch: Right: 25 lbs, Left: 24 lbs   COORDINATION: WFL  SENSATION: WFL  EDEMA: mild edema noted in dorsum of right wrist, also along MPs of the hand.  Circumferential measurements of  right wrist compared to left is normal and symmetrical.  Pt reports increased edema with increased use at times and reports increased edema yesterday.    COGNITION: Overall cognitive status: Within functional limits for tasks assessed   TREATMENT DATE: 04/29/24        Patient is active range of motion within normal limits. Strength in wrist 5/5 pain-free Patient is less fearful and less protective during  strength testing Grip and prehension strength continues to improve each test flowsheet                            Therapeutic Exercises:  Through one-handed 1 kg ball on rebounder catching with both 2 x 1 minute  Chest  throw bilateral 2 kg - 3 kg ball catch and throw bilateral 2 x 1 minute  2 x 15 lbs weighted ball - 12 throw on floor / pick up bilateral  Patient simulating pulling off dummy for her fitness assessment-patient pushed sled and pull 165 pounds pain-free Simulated 3 x 100 feet no pain Attempted push-ups on floor.  Patient able to do pain-free 10 reps.  But noticed some decrease strength in core. Done 20 push-ups on edge of table better posture was able to do 20 pain-free Recommend for patient this week to still work on her planks patient was able to do 1 minute on forearm and toes Was able to do 30 seconds side planks on forearm and feet each side Patient to increase that gradually And at home try and do 2-3 sets of 15-20 push-ups on incline like couch or table And try the end of the week on the floor 10.   Biodex trunk rotation with chest push 15 lbs - 12 reps each side Pain-free 20 pounds kettle bell patient was able to do rotations around body during scapular retraction with elbow extended 12 reps left side and right side      PATIENT EDUCATION: Education details: contrast for edema control, ROM exercises Person educated: Patient Education method: Explanation Education comprehension: verbalized understanding   GOALS: Goals reviewed with patient?  Yes  SHORT TERM GOALS: Target date: 02/09/2024  Pt will demonstrate understanding of use of contrast for home program for edema control of right wrist.  Baseline: no knowledge at eval Goal status: Met  LONG TERM GOALS: Target date: 04/19/2024  Pt will demonstrate home exercise program with modified independence Baseline: no current program Goal status: Met  2.  Pt will demonstrate ability to open jars and containers independently with pain 2/10 or less. Baseline: difficulty at eval and pain noted. NOW 1/10 pain with 3-point pinch volar wrist Goal status: Progressing L  3.  Pt will demonstrate ability to complete self care tasks with modified independence with pain 2/10 or less.   Baseline: difficulty with hair care, tying shoes, pulling up pants and taking off bra. Goal status: Met  4.  Pt will demonstrate ability to open and close doors at work and home independently and pain 2/10 or less.  Baseline: difficulty with opening doors at work, increased pain when performing NOW 1-3/10 discomfort with pushing heavy door with palm Goal status: Met  5.    Pt will return to full duty work tasks including suspect contact independently and safely without difficulty or pain in wrist.  Baseline:  pt currently on light duty and no suspect contact. NOW simulated activities for her fitness test to return to full duty.  Was able to do everything pain-free  goal status: Met   ASSESSMENT:  CLINICAL IMPRESSION: Patient is a 33 y.o. female followed by occupational therapy for right wrist pain.  Patient made great progress from start of care.  Patient made fantastic progress and was able to participate in strengthening the last few sessions.  Gradually increased her weights and sets on the workout machines.  Able to do weighted balls up to 15 pounds.  Able to do kettle bell 20 pounds around body.  Patient able to do forearm planks and side planks.  Patient able to incline push-ups with good posture.   Patient was able to do floor push-ups.  But recommend for her to work a little bit on her planks to improve her posture with push-ups.  Also simulated her pulling dummy for fitness test.  She was able to push and pull sled of 165 pounds symptom-free.  Patient should be ready to return back to full duty as a Archivist for a local [employer] and currently is on light duty with no suspect contact and  was limited with pushing, pulling and lifting.  Patient to follow-up with the PA next week.  Please feel free to contact me if other needs occur.  Otherwise we will discharge her.  She presents with right wrist pain, decreased strength and ROM.  She would benefit from skilled OT services to maximize her independence and safety with tasks at work, home and in the community.    PERFORMANCE DEFICITS: in functional skills including ADLs, IADLs, edema, ROM, strength, and pain, and psychosocial skills including environmental adaptation, habits, and routines and behaviors.   IMPAIRMENTS: are limiting patient from ADLs, IADLs, work, and leisure.   COMORBIDITIES: has no other co-morbidities that affects occupational performance. Patient will benefit from skilled OT to address above impairments and improve overall function.  MODIFICATION OR ASSISTANCE TO COMPLETE EVALUATION: No modification of tasks or assist necessary to complete an evaluation.  OT OCCUPATIONAL PROFILE AND HISTORY: Problem focused assessment: Including review of records relating to presenting problem.  CLINICAL DECISION MAKING: Moderate - several treatment options, min-mod task modification necessary  REHAB POTENTIAL: Excellent  EVALUATION COMPLEXITY: Low  PLAN:  OT FREQUENCY: 1 x wk   OT DURATION:4 wks  PLANNED INTERVENTIONS: 97168 OT Re-evaluation, 97535 self care/ADL training, 02889 therapeutic exercise, 97530 therapeutic activity, 97112 neuromuscular re-education, 97035 ultrasound, 97018 paraffin, 02960 fluidotherapy, 97010 moist  heat, 97010 cryotherapy, and 97034 contrast bath  CONSULTED AND AGREED WITH PLAN OF CARE: Patient   Deland tedra Ip OTR/L,CLT  04/29/2024, 8:56 AM

## 2024-05-06 ENCOUNTER — Other Ambulatory Visit: Payer: Self-pay | Admitting: Physician Assistant

## 2024-05-07 ENCOUNTER — Ambulatory Visit: Payer: Self-pay | Admitting: Physician Assistant

## 2024-05-07 ENCOUNTER — Encounter: Payer: Self-pay | Admitting: Physician Assistant

## 2024-05-07 DIAGNOSIS — Z7689 Persons encountering health services in other specified circumstances: Secondary | ICD-10-CM

## 2024-05-07 NOTE — Progress Notes (Signed)
 Stated she is ready to return to work full duty with essential functions for [employer] post injury right wrist and found OT beneficial.

## 2024-05-07 NOTE — Progress Notes (Signed)
   Subjective: Return to work evaluation    Patient ID: Hayley Avila, female    DOB: October 07, 1990, 33 y.o.   MRN: [id number]  HPI Patient released from occupational therapy for right wrist pain from her sprain while doing training exercise.  Incident occurred in May 2025.  Patient has been in occupational therapy since June 2025.  Patient was released 04/29/2024.  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   Review of Systems Negative except for above complaint    Objective:   Physical Exam Patient is in no acute distress.  Patient is right-hand dominant.  Patient has full and equal range of motion of the right hand/wrist.  Patient is neurovascular intact. Reviewed notes from occupational therapy.      Assessment & Plan: Return to work evaluation   Patient may return back to full duties and advised to follow-up if condition worsens.
